# Patient Record
Sex: Male | Born: 1970 | Race: White | Hispanic: No | Marital: Married
Health system: Midwestern US, Community
[De-identification: ages and names within clinical notes are randomized; demographics above are authoritative.]

## PROBLEM LIST (undated history)

## (undated) DIAGNOSIS — L989 Disorder of the skin and subcutaneous tissue, unspecified: Secondary | ICD-10-CM

## (undated) DIAGNOSIS — K8010 Calculus of gallbladder with chronic cholecystitis without obstruction: Secondary | ICD-10-CM

## (undated) HISTORY — DX: Disorder of the skin and subcutaneous tissue, unspecified: L98.9

## (undated) HISTORY — PX: OTHER SURGICAL HISTORY: SHX169

## (undated) HISTORY — PX: CHOLECYSTECTOMY: SHX55

---

## 2012-10-04 NOTE — ED Notes (Signed)
Pt reports cough since yesterday but he gets bronchitis frequently and he is certain that his cough is going to turn into bronchitis again. Pt states he needs a kenalog shot and a strong antibiotic because that is what he always gets. Pt does not appear in distress and speaks in full sentences.

## 2012-10-04 NOTE — ED Provider Notes (Signed)
Patient is a 42 y.o. male presenting with cough. The history is provided by the patient.   Cough  This is a new problem. The current episode started 12 to 24 hours ago. The problem occurs constantly. The problem has been gradually worsening. The cough is productive of sputum. There has been no fever. Pertinent negatives include no wheezing, no nausea and no vomiting. Associated symptoms comments: Congestion, sinus drainage . He has tried nothing for the symptoms. The treatment provided no relief. He is not a smoker. His past medical history is significant for bronchitis. His past medical history does not include pneumonia or asthma.        History reviewed. No pertinent past medical history.     History reviewed. No pertinent past surgical history.      History reviewed. No pertinent family history.     History     Social History   ??? Marital Status: MARRIED     Spouse Name: N/A     Number of Children: N/A   ??? Years of Education: N/A     Occupational History   ??? Not on file.     Social History Main Topics   ??? Smoking status: Not on file   ??? Smokeless tobacco: Not on file   ??? Alcohol Use: Not on file   ??? Drug Use: Not on file   ??? Sexually Active: Not on file     Other Topics Concern   ??? Not on file     Social History Narrative   ??? No narrative on file                  ALLERGIES: Review of patient's allergies indicates no known allergies.      Review of Systems   Respiratory: Positive for cough. Negative for wheezing.    Gastrointestinal: Negative for nausea and vomiting.   All other systems reviewed and are negative.        Filed Vitals:    10/04/12 1758   BP: 184/96   Pulse: 88   Temp: 99 ??F (37.2 ??C)   Resp: 26   Height: 5\' 10"  (1.778 m)   Weight: 127.007 kg (280 lb)   SpO2: 95%            Physical Exam   Nursing note and vitals reviewed.  Constitutional: He is oriented to person, place, and time. He appears well-developed and well-nourished. No distress.   HENT:   Head: Normocephalic and atraumatic.   Right Ear:  External ear normal.   Left Ear: External ear normal.   Mouth/Throat: Oropharynx is clear and moist.   Upper airway congestion   Eyes: Conjunctivae and EOM are normal. Pupils are equal, round, and reactive to light.   Neck: Normal range of motion. Neck supple.   Cardiovascular: Normal rate and regular rhythm.    Pulmonary/Chest: Effort normal and breath sounds normal. No respiratory distress. He has no wheezes. He has no rales.   Abdominal: Soft. Bowel sounds are normal.   Musculoskeletal: Normal range of motion. He exhibits no edema.   Neurological: He is alert and oriented to person, place, and time.   Skin: Skin is warm.        MDM     Differential Diagnosis; Clinical Impression; Plan:     Chest x ray clear, wll treat bronchitis   Amount and/or Complexity of Data Reviewed:   Tests in the radiology section of CPT??:  Ordered and reviewed  Risk of Significant  Complications, Morbidity, and/or Mortality:   Presenting problems:  Low  Diagnostic procedures:  Low  Management options:  Low  Progress:   Patient progress:  Improved and stable      Procedures

## 2012-10-04 NOTE — ED Provider Notes (Signed)
I was personally available for consultation in the emergency department.  I have reviewed the chart and agree with the documentation recorded by the MLP, including the assessment, treatment plan, and disposition.

## 2012-10-04 NOTE — ED Notes (Signed)
I have reviewed discharge instructions with the patient.  The patient verbalized understanding. Patient was given prescription. Pt ambulatory upon discharge home.

## 2013-06-21 NOTE — ED Provider Notes (Signed)
I was personally available for consultation in the emergency department.  I have reviewed the chart and agree with the documentation recorded by the MLP, including the assessment, treatment plan, and disposition. Victorina Kable R Jessamyn Watterson, MD

## 2013-06-21 NOTE — ED Notes (Signed)
I have reviewed discharge instructions with the patient.  The patient verbalized understanding.

## 2013-06-21 NOTE — ED Provider Notes (Signed)
Patient is a 42 y.o. male presenting with cough. The history is provided by the patient.   Cough  This is a new problem. The current episode started more than 2 days ago. The problem occurs constantly. The problem has been gradually worsening. The cough is non-productive. There has been no fever. Associated symptoms include wheezing. Pertinent negatives include no chills, no sweats and no shortness of breath. Associated symptoms comments: Congestion, sinus drainage . Treatments tried: nyquil. The treatment provided mild relief. He is not a smoker. His past medical history is significant for bronchitis. His past medical history does not include pneumonia.        History reviewed. No pertinent past medical history.     Past Surgical History   Procedure Laterality Date   ??? Hx orthopaedic           History reviewed. No pertinent family history.     History     Social History   ??? Marital Status: MARRIED     Spouse Name: N/A     Number of Children: N/A   ??? Years of Education: N/A     Occupational History   ??? Not on file.     Social History Main Topics   ??? Smoking status: Former Smoker -- 0.50 packs/day for 20 years   ??? Smokeless tobacco: Never Used   ??? Alcohol Use: No   ??? Drug Use: No   ??? Sexually Active: Not on file     Other Topics Concern   ??? Not on file     Social History Narrative   ??? No narrative on file                  ALLERGIES: Review of patient's allergies indicates no known allergies.      Review of Systems   Constitutional: Negative for chills.   Respiratory: Positive for cough and wheezing. Negative for shortness of breath.    All other systems reviewed and are negative.        Filed Vitals:    06/21/13 1100   BP: 173/98   Pulse: 83   Temp: 98.4 ??F (36.9 ??C)   Resp: 20   Height: 5\' 10"  (1.778 m)   Weight: 130.182 kg (287 lb)   SpO2: 96%            Physical Exam   Nursing note and vitals reviewed.  Constitutional: He is oriented to person, place, and time. He appears well-developed and well-nourished. No  distress.   HENT:   Head: Normocephalic and atraumatic.   Right Ear: External ear normal.   Left Ear: External ear normal.   Upper airway congestion    Eyes: Conjunctivae and EOM are normal. Pupils are equal, round, and reactive to light.   Neck: Normal range of motion. Neck supple.   Cardiovascular: Normal rate, regular rhythm and normal heart sounds.    Pulmonary/Chest: Effort normal and breath sounds normal. No respiratory distress. He has no wheezes.   Abdominal: Soft. Bowel sounds are normal.   Musculoskeletal: He exhibits no edema and no tenderness.   Neurological: He is alert and oriented to person, place, and time.   Skin: Skin is warm and dry.   Psychiatric: He has a normal mood and affect.        MDM     Differential Diagnosis; Clinical Impression; Plan:     Bronchitis      Amount and/or Complexity of Data Reviewed:    Review and summarize  past medical records:  Yes  Risk of Significant Complications, Morbidity, and/or Mortality:   Presenting problems:  Low  Diagnostic procedures:  Low  Management options:  Low  Progress:   Patient progress:  Improved and stable      Procedures

## 2015-05-16 ENCOUNTER — Inpatient Hospital Stay
Admit: 2015-05-16 | Discharge: 2015-05-16 | Disposition: A | Discharge status: Home / Self Care | Payer: MEDICAID | Attending: Emergency Medicine

## 2015-05-16 ENCOUNTER — Emergency Department: Admit: 2015-05-16 | Payer: MEDICAID

## 2015-05-16 DIAGNOSIS — K8050 Calculus of bile duct without cholangitis or cholecystitis without obstruction: Secondary | ICD-10-CM

## 2015-05-16 LAB — METABOLIC PANEL, COMPREHENSIVE
A-G Ratio: 1.3 (ref 1.2–3.5)
ALT (SGPT): 20 U/L (ref 12–65)
AST (SGOT): 14 U/L — ABNORMAL LOW (ref 15–37)
Albumin: 3.8 g/dL (ref 3.5–5.0)
Alk. phosphatase: 56 U/L (ref 50–136)
Anion gap: 8 mmol/L (ref 7–16)
BUN: 14 MG/DL (ref 6–23)
Bilirubin, total: 0.3 MG/DL (ref 0.2–1.1)
CO2: 26 mmol/L (ref 21–32)
Calcium: 9.2 MG/DL (ref 8.3–10.4)
Chloride: 107 mmol/L (ref 98–107)
Creatinine: 1.18 MG/DL (ref 0.8–1.5)
GFR est AA: >60 mL/min/{1.73_m2} (ref >60)
GFR est non-AA: >60 mL/min/{1.73_m2} (ref >60)
Globulin: 2.9 g/dL (ref 2.3–3.5)
Glucose: 120 mg/dL — ABNORMAL HIGH (ref 65–100)
Potassium: 3.7 mmol/L (ref 3.5–5.1)
Protein, total: 6.7 g/dL (ref 6.3–8.2)
Sodium: 141 mmol/L (ref 136–145)

## 2015-05-16 LAB — CBC WITH AUTOMATED DIFF
ABS. BASOPHILS: 0.1 10*3/uL (ref 0.0–0.2)
ABS. BASOPHILS: 0 10*3/uL (ref 0.0–0.2)
ABS. EOSINOPHILS: 0.1 10*3/uL (ref 0.0–0.8)
ABS. EOSINOPHILS: 0 10*3/uL (ref 0.0–0.8)
ABS. IMM. GRANS.: 0 10*3/uL (ref 0.0–0.5)
ABS. IMM. GRANS.: 0 10*3/uL (ref 0.0–0.5)
ABS. LYMPHOCYTES: 1.6 10*3/uL (ref 0.5–4.6)
ABS. LYMPHOCYTES: 0.7 10*3/uL (ref 0.5–4.6)
ABS. MONOCYTES: 0.4 10*3/uL (ref 0.1–1.3)
ABS. MONOCYTES: 0.4 10*3/uL (ref 0.1–1.3)
ABS. NEUTROPHILS: 6.6 10*3/uL (ref 1.7–8.2)
ABS. NEUTROPHILS: 10.9 10*3/uL — ABNORMAL HIGH (ref 1.7–8.2)
BASOPHILS: 1 % (ref 0.0–2.0)
BASOPHILS: 0 % (ref 0.0–2.0)
EOSINOPHILS: 2 % (ref 0.5–7.8)
EOSINOPHILS: 0 % — ABNORMAL LOW (ref 0.5–7.8)
HCT: 43 % (ref 41.1–50.3)
HCT: 43.1 % (ref 41.1–50.3)
HGB: 15.2 g/dL (ref 13.6–17.2)
HGB: 14.9 g/dL (ref 13.6–17.2)
IMMATURE GRANULOCYTES: 0.3 % (ref 0.0–5.0)
IMMATURE GRANULOCYTES: 0.1 % (ref 0.0–5.0)
LYMPHOCYTES: 18 % (ref 13–44)
LYMPHOCYTES: 6 % — ABNORMAL LOW (ref 13–44)
MCH: 33.8 PG — ABNORMAL HIGH (ref 26.1–32.9)
MCH: 33.2 PG — ABNORMAL HIGH (ref 26.1–32.9)
MCHC: 35.3 g/dL — ABNORMAL HIGH (ref 31.4–35.0)
MCHC: 34.6 g/dL (ref 31.4–35.0)
MCV: 95.6 FL (ref 79.6–97.8)
MCV: 96 FL (ref 79.6–97.8)
MONOCYTES: 5 % (ref 4.0–12.0)
MONOCYTES: 3 % — ABNORMAL LOW (ref 4.0–12.0)
MPV: 10.5 FL — ABNORMAL LOW (ref 10.8–14.1)
MPV: 10.3 FL — ABNORMAL LOW (ref 10.8–14.1)
NEUTROPHILS: 74 % (ref 43–78)
NEUTROPHILS: 91 % — ABNORMAL HIGH (ref 43–78)
PLATELET: 210 10*3/uL (ref 150–450)
PLATELET: 199 10*3/uL (ref 150–450)
RBC: 4.5 M/uL (ref 4.23–5.67)
RBC: 4.49 M/uL (ref 4.23–5.67)
RDW: 12.6 % (ref 11.9–14.6)
RDW: 12.2 % (ref 11.9–14.6)
WBC: 8.9 10*3/uL (ref 4.3–11.1)
WBC: 12 10*3/uL — ABNORMAL HIGH (ref 4.3–11.1)

## 2015-05-16 LAB — TROPONIN I: Troponin-I, Qt.: <0.04 NG/ML (ref 0.02–0.05)

## 2015-05-16 LAB — HEPATIC FUNCTION PANEL
A-G Ratio: 1.3 (ref 1.2–3.5)
ALT (SGPT): 32 U/L (ref 12–65)
AST (SGOT): 23 U/L (ref 15–37)
Albumin: 3.6 g/dL (ref 3.5–5.0)
Alk. phosphatase: 52 U/L (ref 50–136)
Bilirubin, direct: 0.2 MG/DL (ref <0.4)
Bilirubin, total: 0.6 MG/DL (ref 0.2–1.1)
Globulin: 2.8 g/dL (ref 2.3–3.5)
Protein, total: 6.4 g/dL (ref 6.3–8.2)

## 2015-05-16 LAB — LIPASE
Lipase: 218 U/L (ref 73–393)
Lipase: 139 U/L (ref 73–393)

## 2015-05-16 LAB — POC TROPONIN: Troponin-I (POC): 0 ng/ml (ref 0.0–0.08)

## 2015-05-16 MED ORDER — SODIUM CHLORIDE 0.9 % IV
Freq: Once | INTRAVENOUS | Status: AC
Start: 2015-05-16 — End: 2015-05-16
  Administered 2015-05-16: 17:00:00 via INTRAVENOUS

## 2015-05-16 MED ORDER — SODIUM CHLORIDE 0.9% BOLUS IV
0.9 % | Freq: Once | INTRAVENOUS | Status: AC
Start: 2015-05-16 — End: 2015-05-16
  Administered 2015-05-16: 19:00:00 via INTRAVENOUS

## 2015-05-16 MED ORDER — AMOXICILLIN CLAVULANATE 875 MG-125 MG TAB
875-125 mg | ORAL_TABLET | Freq: Two times a day (BID) | ORAL | 0 refills | Status: AC
Start: 2015-05-16 — End: ?

## 2015-05-16 MED ORDER — ONDANSETRON 4 MG TAB, RAPID DISSOLVE
4 mg | ORAL_TABLET | Freq: Three times a day (TID) | ORAL | 0 refills | Status: AC | PRN
Start: 2015-05-16 — End: ?

## 2015-05-16 MED ORDER — HYDROMORPHONE (PF) 1 MG/ML IJ SOLN
1 mg/mL | Freq: Once | INTRAMUSCULAR | Status: AC
Start: 2015-05-16 — End: 2015-05-16
  Administered 2015-05-16: 17:00:00 via INTRAVENOUS

## 2015-05-16 MED ORDER — PROMETHAZINE 25 MG/ML INJECTION
25 mg/mL | INTRAMUSCULAR | Status: AC
Start: 2015-05-16 — End: 2015-05-16
  Administered 2015-05-16: 17:00:00 via INTRAMUSCULAR

## 2015-05-16 MED ORDER — OXYCODONE 5 MG TAB
5 mg | ORAL_TABLET | ORAL | 0 refills | Status: AC
Start: 2015-05-16 — End: ?

## 2015-05-16 MED FILL — HYDROMORPHONE (PF) 1 MG/ML IJ SOLN: 1 mg/mL | INTRAMUSCULAR | Qty: 1

## 2015-05-16 MED FILL — PROMETHAZINE 25 MG/ML INJECTION: 25 mg/mL | INTRAMUSCULAR | Qty: 1

## 2015-05-16 NOTE — ED Notes (Signed)
One side rail up, bed in low position,bed brakes on, call light within reach.  Pt resting in bed,resp easy,VSS,family at bedside, belongs in reach. Offered restroom and change in position.

## 2015-05-16 NOTE — ED Provider Notes (Signed)
HPI Comments: Patient had been normal when he got up this morning he then developed intense pain radiating into his back and later for additional pain to the central abdomen and right upper abdomen.  There was no chest discomfort.  There is no shortness of breath.  He did have nausea and vomiting on the way to the emergency department and a single time in our department after arrival.  Pancreatitis.  He has no history of known gallbladder disease.  He states to specific reviewing no GI history known to him in the past.  He had eaten a moderately fatty breakfast.  No fever or chills.  It is of note patient has had about 130 pound weight loss over the last somewhat over 1 year.  This is been by voluntary dietary changes.  No abdominal distention is noted.    Patient is a 44 y.o. male presenting with back pain. The history is provided by the patient.   Back Pain    This is a new problem. The current episode started less than 1 hour ago. The pain is associated with no known injury. The pain is severe. Associated symptoms include abdominal pain. Pertinent negatives include no abdominal swelling, no bladder incontinence, no dysuria and no paresthesias. He has tried nothing for the symptoms.        History reviewed. No pertinent past medical history.    Past Surgical History:   Procedure Laterality Date   ??? Hx orthopaedic           History reviewed. No pertinent family history.    Social History     Social History   ??? Marital status: MARRIED     Spouse name: N/A   ??? Number of children: N/A   ??? Years of education: N/A     Occupational History   ??? Not on file.     Social History Main Topics   ??? Smoking status: Former Smoker     Packs/day: 0.50     Years: 20.00   ??? Smokeless tobacco: Never Used   ??? Alcohol use No   ??? Drug use: No   ??? Sexual activity: Not on file     Other Topics Concern   ??? Not on file     Social History Narrative         ALLERGIES: Review of patient's allergies indicates no known allergies.     Review of Systems   Gastrointestinal: Positive for abdominal pain, nausea and vomiting. Negative for diarrhea.   Genitourinary: Negative.  Negative for bladder incontinence and dysuria.   Musculoskeletal: Positive for back pain.   Neurological: Negative.  Negative for paresthesias.   All other systems reviewed and are negative.      Vitals:    05/16/15 1243 05/16/15 1330 05/16/15 1348 05/16/15 1501   BP:    145/77   Pulse: (!) 43 (!) 46 (!) 58 (!) 43   Resp: 20   16   Temp:       SpO2: 100% 98% 98% 99%   Weight:       Height:                Physical Exam   Constitutional: He appears well-developed and well-nourished. He appears distressed.   Arrival patient appeared to be in extreme pain was very restless and somewhat diaphoretic.   HENT:   Head: Atraumatic.   Mouth/Throat: Oropharynx is clear and moist.   Eyes: No scleral icterus.   Neck: Neck supple.  Cardiovascular: Normal rate and regular rhythm.    Pulmonary/Chest: Effort normal. No respiratory distress.   Abdominal: Soft. Normal aorta. Bowel sounds are decreased. There is tenderness. There is positive Murphy's sign. There is no rigidity, no guarding and no tenderness at McBurney's point. No hernia.   Musculoskeletal: Normal range of motion. He exhibits no tenderness.   Neurological: He is alert. Coordination normal.   Skin: Skin is warm. He is diaphoretic.   Psychiatric: His behavior is normal. Thought content normal.   Nursing note and vitals reviewed.       MDM  Number of Diagnoses or Management Options  Diagnosis management comments: Acute colically mid abd/ruq pain with intense associate mid back pain. Of concern for GB and pancreas issue. Also could br ulcer or colitis or other gi issue       Amount and/or Complexity of Data Reviewed  Clinical lab tests: reviewed and ordered  Tests in the radiology section of CPT??: reviewed and ordered  Obtain history from someone other than the patient: yes  Discuss the patient with other providers: yes   Independent visualization of images, tracings, or specimens: yes    Risk of Complications, Morbidity, and/or Mortality  Presenting problems: high  Diagnostic procedures: low  Management options: high    Patient Progress  Patient progress: stable      Procedures       ?? [place] ABD LTD (Final result) Result time: 05/16/15 14:22:05   ?? Final result by Ian Malkin., MD (05/16/15 14:22:05)   ?? Impression:   ?? IMPRESSION: Cholelithiasis with positive sonographic Murphy's sign. These  findings may be present with acute cholecystitis. Consider surgical evaluation.     ?? Narrative:   ?? RIGHT UPPER QUADRANT ULTRASOUND 05/16/2015    HISTORY: acute severe upper/mid abdominal pain with radiation into his back; ?? ??    TECHNIQUE: Sonographic imaging of the right upper quadrant was performed.    COMPARISON: None    FINDINGS: The pancreatic head is normal in appearance ??There is no intrahepatic  biliary ductal dilatation. ??The gallbladder is distended with multiple shadowing  gallstones. ??The sonographic Murphy's sign is present.     ??The common bile duct is 4 mm in diameter. ??The right kidney is 12.1 cm in  length. ??There is no hydronephrosis. ??The distal aorta is 1.9cm in diameter.   The IVC is patent.     ??    ??     Recent Results (from the past 12 hour(s))   CBC WITH AUTOMATED DIFF    Collection Time: 05/16/15 12:30 PM   Result Value Ref Range    WBC 8.9 4.3 - 11.1 K/uL    RBC 4.50 4.23 - 5.67 M/uL    HGB 15.2 13.6 - 17.2 g/dL    HCT 43.0 41.1 - 50.3 %    MCV 95.6 79.6 - 97.8 FL    MCH 33.8 (H) 26.1 - 32.9 PG    MCHC 35.3 (H) 31.4 - 35.0 g/dL    RDW 12.6 11.9 - 14.6 %    PLATELET 210 150 - 450 K/uL    MPV 10.5 (L) 10.8 - 14.1 FL    DF AUTOMATED      NEUTROPHILS 74 43 - 78 %    LYMPHOCYTES 18 13 - 44 %    MONOCYTES 5 4.0 - 12.0 %    EOSINOPHILS 2 0.5 - 7.8 %    BASOPHILS 1 0.0 - 2.0 %    IMMATURE GRANULOCYTES 0.3 0.0 -  5.0 %    ABS. NEUTROPHILS 6.6 1.7 - 8.2 K/UL    ABS. LYMPHOCYTES 1.6 0.5 - 4.6 K/UL     ABS. MONOCYTES 0.4 0.1 - 1.3 K/UL    ABS. EOSINOPHILS 0.1 0.0 - 0.8 K/UL    ABS. BASOPHILS 0.1 0.0 - 0.2 K/UL    ABS. IMM. GRANS. 0.0 0.0 - 0.5 K/UL   METABOLIC PANEL, COMPREHENSIVE    Collection Time: 05/16/15 12:30 PM   Result Value Ref Range    Sodium 141 136 - 145 mmol/L    Potassium 3.7 3.5 - 5.1 mmol/L    Chloride 107 98 - 107 mmol/L    CO2 26 21 - 32 mmol/L    Anion gap 8 7 - 16 mmol/L    Glucose 120 (H) 65 - 100 mg/dL    BUN 14 6 - 23 MG/DL    Creatinine 1.18 0.8 - 1.5 MG/DL    GFR est AA >60 >60 ml/min/1.66m    GFR est non-AA >60 >60 ml/min/1.762m   Calcium 9.2 8.3 - 10.4 MG/DL    Bilirubin, total 0.3 0.2 - 1.1 MG/DL    ALT 20 12 - 65 U/L    AST 14 (L) 15 - 37 U/L    Alk. phosphatase 56 50 - 136 U/L    Protein, total 6.7 6.3 - 8.2 g/dL    Albumin 3.8 3.5 - 5.0 g/dL    Globulin 2.9 2.3 - 3.5 g/dL    A-G Ratio 1.3 1.2 - 3.5     LIPASE    Collection Time: 05/16/15 12:30 PM   Result Value Ref Range    Lipase 218 73 - 393 U/L   POC TROPONIN-I    Collection Time: 05/16/15 12:31 PM   Result Value Ref Range    Troponin-I (POC) 0 0.0 - 0.08 ng/ml

## 2015-05-16 NOTE — ED Notes (Signed)
PMD-None. Pt c/o bilateral flank/back pain. Onset 20 minutes ago.

## 2015-05-16 NOTE — ED Notes (Signed)
After assessing pt, he had informed me that this pain started in his mid epigastric region while he was in the shower that then radiated into his back. Pt has no cardiac/gastro hx. Pt states he vomited on the way here. Pt then started to raise his chest up off the bed stating that his pain is his stomach was so severe. Pt became diaphoretic and thriving around int he bed with pain. I preceded to hook pt up to cardiac monitor/ EKG. Dr Verlon Au reviewed and came ti assess pt at approx. 1230.

## 2015-05-16 NOTE — ED Notes (Signed)
Pt returned form ultrasound

## 2015-05-16 NOTE — H&P (View-Only) (Signed)
Kasandra Knudsen, M.D.   Colon and Rectal Surgeon  [facility]  [address]  [place], [place] [address]  Phone: [phone] Fax: [phone]      ED Evaluation/preop H&P Note  05/16/2015    Gregory Hines  MRN: [id number]    Requesting Provider: Marlowe Sax, MD    Primary Care Physician: None    Reason for Consult: abdominal pain    HISTORY OF PRESENT ILLNESS:   Gregory Hines is a 44 y.o. year old male who presented to the emergency department for abdominal pain.  Pain started this morning.  He ate ham and eggs for breakfast and went to take a shower.  He developed severe, knife-like epigastric pain that radiated into his back about 10 minutes after the shower and had cold sweats.  He tried to take some Rolaids without improvement.  He reported it as 9-10/10 in severity.  He had never had similar pain before.  He had associated nausea and vomiting.  Because of the pain, he came to the ED.    He denies any urinary pain or hematuria.  He denies a history of cardiac problems.  He has a significant weight loss from 307-->173 from diet alone and feels to be in relatively good health.  He does report chronic constipation and averages a BM about every 2 weeks.  He has tried several medications in the past for that without improvement.  In the ED, he was given dilaudid 0.5 mg and was heavily sedated from this for several hours.  He currently has minimal abdominal discomfort, no n/v.    REVIEW OF SYSTEMS:   Constitutional: No fevers/chills, no weakness, no fatigue, no lightheadedness, no night sweats, no insomnia, no appetite changes, no weight changes, no memory problems.   Eyes: No changes in vision, no diplopia, no tearing, no scotomata, no pain   Ears/Nose/Throat/Mouth:  No change in hearing, no tinnitus, no bleeding, no epistaxis, no nasal discharge, no sinusitis.   Respiratory: No cough, no dyspnea, no wheezing, no hemoptysis, no sleep apnea    Cardiovascular: No chest pains, no chest pressure, no chest tightness, no palpitations   Gastrointestinal: +abdominal pains, no bowel habit changes, +nausea, +emesis, no hematochezia, no melena, no cramping, +constipation, no diarrhea, no incontinence, no jaundice, no diverticulosis. Otherwise per HPI   Genitourinary: No dysuria, no hematuria, no urinary frequency, no nocturia, no recent UTIs   Musculoskeletal: No back/neck pain, no arthritis, no joint pain/swelling, no muscle pains   Skin: No rashes, no itching, no ulcers   Hematologic:  No easy bruising, no anemia   Neurologic: No headaches, no dizziness, no seizures   Psychiatric: No depressive symptoms, no anxiety symptoms, no changes in mood, no substance abuse     PAST MEDICAL HISTORY:  None    PAST SURGICAL HISTORY:  Right knee arthroscopy, left hand ganglion cyst, colonoscopy ~2008    ALLERGIES: No Known Allergies    HOME MEDICATIONS: None    SOCIAL HISTORY: Married to his wife of 15 years.  They live in [place].  He has two children.  He is a [employer].  Smokes 1.5 ppd x 30 years.  NO alcohol use    PREVENTION: Last colonoscopy ~2008--in [place], negative    FAMILY HISTORY: No colon or rectal cancer.  No history of polyps.  No breast, prostate, ovary, endometrial, gastric, or pancreatic cancers.    PHYSICAL EXAM:  Blood pressure 145/77, pulse (!) 57, temperature 97.4 ??F (36.3 ??C), resp. rate Marland Kitchen)  55, height 5\' 10"  (1.778 m), weight 174 lb (78.9 kg), SpO2 99 %.  Body mass index is 24.97 kg/(m^2).  The patient was evaluated in the presence of a medical assistant  General: Normotensive, in no acute distress, well developed, well nourished appearing   Head:  AT/NC, no lesions   Eyes:  PERRLA, EOM's full, conjunctivae clear   Neck:  Supple, no masses, no lymphadenopathy, no thyromegaly, no carotid bruits   Chest:  Lungs clear, no rales, no rhonchi, no wheezes   Heart:  RR, no murmurs, no rubs, no gallops    Abdomen:  Soft, Very minimal epigastric tenderness with deep palpation, negative palpation in RUQ.  no rebound, no guarding, no masses, nondistended.   Excess skin consistent with significant weight loss   Rectal:  Deferred   Back:  Normal curvature, no tenderness.  Negative Murphy's punch.   Extremities:  FROM, no deformities, no edema, no erythema   Neuro:  Physiologic, oriented x3, full affect, no localizing findings   Skin:  Normal, no rashes, no lesions noted.          Recent Labs      05/16/15   1601  05/16/15   1230   WBC  12.0*  8.9   HGB  14.9  15.2   HCT  43.1  43.0   PLT  199  210       Recent Labs      05/16/15   1601  05/16/15   1230   NA   --   141   K   --   3.7   CL   --   107   CO2   --   26   BUN   --   14   CREA   --   1.18   TBILI  0.6  0.3   SGOT  23  14*   ALT  32  20   AP  52  56       Lipase 218-->139  Troponin negative x 2  Labs reviewed by me.    IMAGING: Images reviewed by me.  05/16/15--Ultrasound: "The pancreatic head is normal in appearance There is no intrahepatic biliary ductal dilatation. The gallbladder is distended with multiple shadowing gallstones. The sonographic Murphy's sign is present. ?? ?? The common bile duct is 4 mm in diameter. The right kidney is 12.1 cm in length. There is no hydronephrosis. The distal aorta is 1.9cm in diameter. ?? The IVC is patent. ?? IMPRESSION: Cholelithiasis with positive sonographic Murphy's sign. These findings may be present with acute cholecystitis. Consider surgical evaluation."    ASSESSMENT:  Biliary colic, improved  Epigastric abdominal pain, due to above    RECOMMENDATIONS:  --He is nontoxic appearing now and has normal LFTs.  His pain is now minimal/resolved.  --I think he is safe to discharge home with planned cholecystectomy later this week.  He is open to this plan  --I gave him a script for Oxycodone 5 mg q4h prn pain, #40, no refills  --I gave him a script for Zofran ODT 4 mg SQ q6h prn n/v, #20, no refills   --I gave him a script for Augmentin 875/125 1 tab BID x 5 days, in case there is a component of cholecystitis, but I do not think so.  --I instructed him to stay on a light, low/no fat diet for the next three days until his operation  --I will get my office to schedule him for  Wednesday afternoon  --I will check labs in preop to assure we do not need to do a cholangiogram  --I instructed him to call and return if he develops recurrence of the severe, intractable pain, and we will admit him for the operation.    --I had an informed consent discussion with the patient for laparoscopic or open cholecystectomy with possible intraoperative cholangiogram.  I delineated the risks of bleeding, infection, abscess, damage to CBD, retained CBD stone, CBD stricture, damage to bowel, bladder, blood vessels, nerves, stomach, liver, bile leak, hernia, need for reoperation, need for further procedures, failure of the procedure to resolve her nausea/abdominal pain, DVT, PE, UTI and the usual cardiac and respiratory risks inherent in surgery.  Questions were answered.  I drew pictures to explain things.     --Discussed with patient, wife in ED and with Dr Verlon Au    Kasandra Knudsen, MD  05/16/2015      Elements of this note have been created with speech-recognition software.  As a result, errors in speech recognition may have occurred.

## 2015-05-16 NOTE — Progress Notes (Signed)
Jaelynn Currier M. Shaira Sova, M.D.   Colon and Rectal Surgeon  [facility]  [address], [address]  [place] [address]  Phone: [phone] Fax: [phone]      ED Evaluation/preop H&P Note  05/16/2015    Danyael Fogarty  MRN: [id number]    Requesting Provider: Daniel Wetenhall, MD    Primary Care Physician: None    Reason for Consult: abdominal pain    HISTORY OF PRESENT ILLNESS:   Gregory Hines is a 44 y.o. year old male who presented to the emergency department for abdominal pain.  Pain started this morning.  He ate ham and eggs for breakfast and went to take a shower.  He developed severe, knife-like epigastric pain that radiated into his back about 10 minutes after the shower and had cold sweats.  He tried to take some Rolaids without improvement.  He reported it as 9-10/10 in severity.  He had never had similar pain before.  He had associated nausea and vomiting.  Because of the pain, he came to the ED.    He denies any urinary pain or hematuria.  He denies a history of cardiac problems.  He has a significant weight loss from 307-->173 from diet alone and feels to be in relatively good health.  He does report chronic constipation and averages a BM about every 2 weeks.  He has tried several medications in the past for that without improvement.  In the ED, he was given dilaudid 0.5 mg and was heavily sedated from this for several hours.  He currently has minimal abdominal discomfort, no n/v.    REVIEW OF SYSTEMS:   Constitutional: No fevers/chills, no weakness, no fatigue, no lightheadedness, no night sweats, no insomnia, no appetite changes, no weight changes, no memory problems.   Eyes: No changes in vision, no diplopia, no tearing, no scotomata, no pain   Ears/Nose/Throat/Mouth:  No change in hearing, no tinnitus, no bleeding, no epistaxis, no nasal discharge, no sinusitis.   Respiratory: No cough, no dyspnea, no wheezing, no hemoptysis, no sleep apnea    Cardiovascular: No chest pains, no chest pressure, no chest tightness, no palpitations   Gastrointestinal: +abdominal pains, no bowel habit changes, +nausea, +emesis, no hematochezia, no melena, no cramping, +constipation, no diarrhea, no incontinence, no jaundice, no diverticulosis. Otherwise per HPI   Genitourinary: No dysuria, no hematuria, no urinary frequency, no nocturia, no recent UTIs   Musculoskeletal: No back/neck pain, no arthritis, no joint pain/swelling, no muscle pains   Skin: No rashes, no itching, no ulcers   Hematologic:  No easy bruising, no anemia   Neurologic: No headaches, no dizziness, no seizures   Psychiatric: No depressive symptoms, no anxiety symptoms, no changes in mood, no substance abuse     PAST MEDICAL HISTORY:  None    PAST SURGICAL HISTORY:  Right knee arthroscopy, left hand ganglion cyst, colonoscopy ~2008    ALLERGIES: No Known Allergies    HOME MEDICATIONS: None    SOCIAL HISTORY: Married to his wife of 15 years.  They live in [place].  He has two children.  He is a truck driver.  Smokes 1.5 ppd x 30 years.  NO alcohol use    PREVENTION: Last colonoscopy ~2008--in [place], negative    FAMILY HISTORY: No colon or rectal cancer.  No history of polyps.  No breast, prostate, ovary, endometrial, gastric, or pancreatic cancers.    PHYSICAL EXAM:  Blood pressure 145/77, pulse (!) 57, temperature 97.4 ??F (36.3 ??C), resp. rate (!)   55, height 5' 10" (1.778 m), weight 174 lb (78.9 kg), SpO2 99 %.  Body mass index is 24.97 kg/(m^2).  The patient was evaluated in the presence of a medical assistant  General: Normotensive, in no acute distress, well developed, well nourished appearing   Head:  AT/NC, no lesions   Eyes:  PERRLA, EOM's full, conjunctivae clear   Neck:  Supple, no masses, no lymphadenopathy, no thyromegaly, no carotid bruits   Chest:  Lungs clear, no rales, no rhonchi, no wheezes   Heart:  RR, no murmurs, no rubs, no gallops    Abdomen:  Soft, Very minimal epigastric tenderness with deep palpation, negative palpation in RUQ.  no rebound, no guarding, no masses, nondistended.   Excess skin consistent with significant weight loss   Rectal:  Deferred   Back:  Normal curvature, no tenderness.  Negative Murphy's punch.   Extremities:  FROM, no deformities, no edema, no erythema   Neuro:  Physiologic, oriented x3, full affect, no localizing findings   Skin:  Normal, no rashes, no lesions noted.          Recent Labs      05/16/15   1601  05/16/15   1230   WBC  12.0*  8.9   HGB  14.9  15.2   HCT  43.1  43.0   PLT  199  210       Recent Labs      05/16/15   1601  05/16/15   1230   NA   --   141   K   --   3.7   CL   --   107   CO2   --   26   BUN   --   14   CREA   --   1.18   TBILI  0.6  0.3   SGOT  23  14*   ALT  32  20   AP  52  56       Lipase 218-->139  Troponin negative x 2  Labs reviewed by me.    IMAGING: Images reviewed by me.  05/16/15--Ultrasound: "The pancreatic head is normal in appearance There is no intrahepatic biliary ductal dilatation. The gallbladder is distended with multiple shadowing gallstones. The sonographic Murphy's sign is present. ?? ?? The common bile duct is 4 mm in diameter. The right kidney is 12.1 cm in length. There is no hydronephrosis. The distal aorta is 1.9cm in diameter. ?? The IVC is patent. ?? IMPRESSION: Cholelithiasis with positive sonographic Murphy's sign. These findings may be present with acute cholecystitis. Consider surgical evaluation."    ASSESSMENT:  Biliary colic, improved  Epigastric abdominal pain, due to above    RECOMMENDATIONS:  --He is nontoxic appearing now and has normal LFTs.  His pain is now minimal/resolved.  --I think he is safe to discharge home with planned cholecystectomy later this week.  He is open to this plan  --I gave him a script for Oxycodone 5 mg q4h prn pain, #40, no refills  --I gave him a script for Zofran ODT 4 mg SQ q6h prn n/v, #20, no refills   --I gave him a script for Augmentin 875/125 1 tab BID x 5 days, in case there is a component of cholecystitis, but I do not think so.  --I instructed him to stay on a light, low/no fat diet for the next three days until his operation  --I will get my office to schedule him for   Wednesday afternoon  --I will check labs in preop to assure we do not need to do a cholangiogram  --I instructed him to call and return if he develops recurrence of the severe, intractable pain, and we will admit him for the operation.    --I had an informed consent discussion with the patient for laparoscopic or open cholecystectomy with possible intraoperative cholangiogram.  I delineated the risks of bleeding, infection, abscess, damage to CBD, retained CBD stone, CBD stricture, damage to bowel, bladder, blood vessels, nerves, stomach, liver, bile leak, hernia, need for reoperation, need for further procedures, failure of the procedure to resolve her nausea/abdominal pain, DVT, PE, UTI and the usual cardiac and respiratory risks inherent in surgery.  Questions were answered.  I drew pictures to explain things.     --Discussed with patient, wife in ED and with Dr Wetenhall    Aldin Drees M Sanjna Haskew, MD  05/16/2015      Elements of this note have been created with speech-recognition software.  As a result, errors in speech recognition may have occurred.

## 2015-05-16 NOTE — ED Notes (Signed)
Pt taken to ultrasound.

## 2015-05-17 LAB — EKG, 12 LEAD, INITIAL
Atrial Rate: 43 {beats}/min
Calculated P Axis: 56 degrees
Calculated R Axis: 56 degrees
Calculated T Axis: 61 degrees
P-R Interval: 170 ms
Q-T Interval: 458 ms
QRS Duration: 106 ms
QTC Calculation (Bezet): 387 ms
Ventricular Rate: 43 {beats}/min

## 2015-05-18 NOTE — Other (Addendum)
Patient verified name, DOB, and surgery as listed in [facility].     Type 2 surgery, phone assessment complete.  Orders  received.    Labs per surgeon: CBC, BMP, Lipase (to be done DOS)  Labs per anesthesia protocol:    Patient answered medical/surgical history questions at the best of their ability. All prior to admission medications documented in [facility].    Patient instructed to take the following medications the day of surgery according to anesthesia guidelines with a small sip of water: zofran, oxycodone if needed     Patient instructed on the following and verbalizes understanding:  Arrive at Entrance A, time of arrival to be called the day before by 1700  NPO after midnight including gum, mints, and ice chips  Hold all vitamins 7 days prior to surgery and NSAIDS 5 days prior to surgery.  Use Hibiclens in shower the night before surgery and on the morning of surgery  Leave all valuables(money and jewelry) at home but bring insurance card and ID on DOS  Do not wear make-up, nail polish, lotions, cologne, perfumes, powders, or oil on skin

## 2015-05-19 ENCOUNTER — Inpatient Hospital Stay

## 2015-05-19 ENCOUNTER — Inpatient Hospital Stay: Payer: MEDICAID

## 2015-05-19 LAB — CBC WITH AUTOMATED DIFF
ABS. BASOPHILS: 0.1 10*3/uL (ref 0.0–0.2)
ABS. EOSINOPHILS: 0.2 10*3/uL (ref 0.0–0.8)
ABS. IMM. GRANS.: 0 10*3/uL (ref 0.0–0.5)
ABS. LYMPHOCYTES: 2.1 10*3/uL (ref 0.5–4.6)
ABS. MONOCYTES: 0.6 10*3/uL (ref 0.1–1.3)
ABS. NEUTROPHILS: 4.2 10*3/uL (ref 1.7–8.2)
BASOPHILS: 1 % (ref 0.0–2.0)
EOSINOPHILS: 3 % (ref 0.5–7.8)
HCT: 42.7 % (ref 41.1–50.3)
HGB: 15 g/dL (ref 13.6–17.2)
IMMATURE GRANULOCYTES: 0.1 % (ref 0.0–5.0)
LYMPHOCYTES: 30 % (ref 13–44)
MCH: 33.6 PG — ABNORMAL HIGH (ref 26.1–32.9)
MCHC: 35.1 g/dL — ABNORMAL HIGH (ref 31.4–35.0)
MCV: 95.7 FL (ref 79.6–97.8)
MONOCYTES: 8 % (ref 4.0–12.0)
MPV: 10.8 FL (ref 10.8–14.1)
NEUTROPHILS: 58 % (ref 43–78)
PLATELET: 220 10*3/uL (ref 150–450)
RBC: 4.46 M/uL (ref 4.23–5.67)
RDW: 12.5 % (ref 11.9–14.6)
WBC: 7.1 10*3/uL (ref 4.3–11.1)

## 2015-05-19 LAB — METABOLIC PANEL, COMPREHENSIVE
A-G Ratio: 1.2 (ref 1.2–3.5)
ALT (SGPT): 23 U/L (ref 12–65)
AST (SGOT): 14 U/L — ABNORMAL LOW (ref 15–37)
Albumin: 3.8 g/dL (ref 3.5–5.0)
Alk. phosphatase: 57 U/L (ref 50–136)
Anion gap: 12 mmol/L (ref 7–16)
BUN: 10 MG/DL (ref 6–23)
Bilirubin, total: 0.8 MG/DL (ref 0.2–1.1)
CO2: 24 mmol/L (ref 21–32)
Calcium: 9.4 MG/DL (ref 8.3–10.4)
Chloride: 106 mmol/L (ref 98–107)
Creatinine: 0.8 MG/DL (ref 0.8–1.5)
GFR est AA: >60 mL/min/{1.73_m2} (ref >60)
GFR est non-AA: >60 mL/min/{1.73_m2} (ref >60)
Globulin: 3.2 g/dL (ref 2.3–3.5)
Glucose: 82 mg/dL (ref 65–100)
Potassium: 3.7 mmol/L (ref 3.5–5.1)
Protein, total: 7 g/dL (ref 6.3–8.2)
Sodium: 142 mmol/L (ref 136–145)

## 2015-05-19 LAB — LIPASE: Lipase: 149 U/L (ref 73–393)

## 2015-05-19 MED ORDER — NALOXONE 0.4 MG/ML INJECTION
0.4 mg/mL | INTRAMUSCULAR | Status: DC | PRN
Start: 2015-05-19 — End: 2015-05-19

## 2015-05-19 MED ORDER — ONDANSETRON (PF) 4 MG/2 ML INJECTION
4 mg/2 mL | INTRAMUSCULAR | Status: DC | PRN
Start: 2015-05-19 — End: 2015-05-19
  Administered 2015-05-19: 18:00:00 via INTRAVENOUS

## 2015-05-19 MED ORDER — MIDAZOLAM 1 MG/ML IJ SOLN
1 mg/mL | Freq: Once | INTRAMUSCULAR | Status: DC | PRN
Start: 2015-05-19 — End: 2015-05-19

## 2015-05-19 MED ORDER — LIDOCAINE (PF) 20 MG/ML (2 %) IV SYRINGE
100 mg/5 mL (2 %) | INTRAVENOUS | Status: AC
Start: 2015-05-19 — End: ?

## 2015-05-19 MED ORDER — FENTANYL CITRATE (PF) 50 MCG/ML IJ SOLN
50 mcg/mL | INTRAMUSCULAR | Status: DC | PRN
Start: 2015-05-19 — End: 2015-05-19
  Administered 2015-05-19 (×3): via INTRAVENOUS

## 2015-05-19 MED ORDER — ROCURONIUM 10 MG/ML IV
10 mg/mL | INTRAVENOUS | Status: DC | PRN
Start: 2015-05-19 — End: 2015-05-19
  Administered 2015-05-19 (×3): via INTRAVENOUS

## 2015-05-19 MED ORDER — FENTANYL CITRATE (PF) 50 MCG/ML IJ SOLN
50 mcg/mL | INTRAMUSCULAR | Status: AC
Start: 2015-05-19 — End: ?

## 2015-05-19 MED ORDER — IOTHALAMATE MEGLUMINE 60 % INJECTION
60 % | INTRAMUSCULAR | Status: AC
Start: 2015-05-19 — End: ?

## 2015-05-19 MED ORDER — LIDOCAINE (PF) 20 MG/ML (2 %) IJ SOLN
20 mg/mL (2 %) | INTRAMUSCULAR | Status: DC | PRN
Start: 2015-05-19 — End: 2015-05-19
  Administered 2015-05-19: 17:00:00 via INTRAVENOUS

## 2015-05-19 MED ORDER — ROCURONIUM 10 MG/ML IV
10 mg/mL | INTRAVENOUS | Status: AC
Start: 2015-05-19 — End: ?

## 2015-05-19 MED ORDER — DEXAMETHASONE SODIUM PHOSPHATE 4 MG/ML IJ SOLN
4 mg/mL | INTRAMUSCULAR | Status: DC | PRN
Start: 2015-05-19 — End: 2015-05-19
  Administered 2015-05-19: 17:00:00 via INTRAVENOUS

## 2015-05-19 MED ORDER — LACTATED RINGERS IV
INTRAVENOUS | Status: DC
Start: 2015-05-19 — End: 2015-05-19

## 2015-05-19 MED ORDER — DEXAMETHASONE SODIUM PHOSPHATE 10 MG/ML IJ SOLN
10 mg/mL | INTRAMUSCULAR | Status: AC
Start: 2015-05-19 — End: ?

## 2015-05-19 MED ORDER — KETOROLAC TROMETHAMINE 30 MG/ML INJECTION
30 mg/mL (1 mL) | INTRAMUSCULAR | Status: AC
Start: 2015-05-19 — End: ?

## 2015-05-19 MED ORDER — ACETAMINOPHEN 1,000 MG/100 ML (10 MG/ML) IV
1000 mg/100 mL (10 mg/mL) | Freq: Once | INTRAVENOUS | Status: AC
Start: 2015-05-19 — End: 2015-05-19
  Administered 2015-05-19: 19:00:00 via INTRAVENOUS

## 2015-05-19 MED ORDER — CEFAZOLIN 2 GRAM/50 ML NS IVPB
Freq: Once | INTRAVENOUS | Status: AC
Start: 2015-05-19 — End: 2015-05-19
  Administered 2015-05-19: 17:00:00 via INTRAVENOUS

## 2015-05-19 MED ORDER — SUCCINYLCHOLINE CHLORIDE 20 MG/ML INJECTION
20 mg/mL | INTRAMUSCULAR | Status: AC
Start: 2015-05-19 — End: ?

## 2015-05-19 MED ORDER — INDOCYANINE GREEN 25 MG INJECTION
25 mg | INTRAMUSCULAR | Status: AC
Start: 2015-05-19 — End: ?

## 2015-05-19 MED ORDER — ONDANSETRON (PF) 4 MG/2 ML INJECTION
4 mg/2 mL | INTRAMUSCULAR | Status: AC
Start: 2015-05-19 — End: ?

## 2015-05-19 MED ORDER — OXYCODONE 5 MG TAB
5 mg | Freq: Once | ORAL | Status: DC | PRN
Start: 2015-05-19 — End: 2015-05-19

## 2015-05-19 MED ORDER — PROMETHAZINE 25 MG/ML INJECTION
25 mg/mL | INTRAMUSCULAR | Status: DC | PRN
Start: 2015-05-19 — End: 2015-05-19

## 2015-05-19 MED ORDER — KETOROLAC TROMETHAMINE 30 MG/ML INJECTION
30 mg/mL (1 mL) | INTRAMUSCULAR | Status: DC | PRN
Start: 2015-05-19 — End: 2015-05-19
  Administered 2015-05-19: 18:00:00 via INTRAVENOUS

## 2015-05-19 MED ORDER — BUPIVACAINE-EPINEPHRINE (PF) 0.25 %-1:200,000 IJ SOLN
0.25 %-1:200,000 | INTRAMUSCULAR | Status: DC | PRN
Start: 2015-05-19 — End: 2015-05-19
  Administered 2015-05-19: 17:00:00 via SUBCUTANEOUS

## 2015-05-19 MED ORDER — GLYCOPYRROLATE 0.2 MG/ML IJ SOLN
0.2 mg/mL | INTRAMUSCULAR | Status: DC | PRN
Start: 2015-05-19 — End: 2015-05-19
  Administered 2015-05-19: 18:00:00 via INTRAVENOUS

## 2015-05-19 MED ORDER — PROPOFOL 10 MG/ML IV EMUL
10 mg/mL | INTRAVENOUS | Status: AC
Start: 2015-05-19 — End: ?

## 2015-05-19 MED ORDER — LACTATED RINGERS IV
INTRAVENOUS | Status: DC
Start: 2015-05-19 — End: 2015-05-19
  Administered 2015-05-19 (×2): via INTRAVENOUS

## 2015-05-19 MED ORDER — SUCCINYLCHOLINE CHLORIDE 20 MG/ML INJECTION
20 mg/mL | INTRAMUSCULAR | Status: DC | PRN
Start: 2015-05-19 — End: 2015-05-19
  Administered 2015-05-19: 17:00:00 via INTRAVENOUS

## 2015-05-19 MED ORDER — NEOSTIGMINE METHYLSULFATE 1 MG/ML INJECTION
1 mg/mL | INTRAMUSCULAR | Status: AC
Start: 2015-05-19 — End: ?

## 2015-05-19 MED ORDER — BUPIVACAINE-EPINEPHRINE (PF) 0.25 %-1:200,000 IJ SOLN
0.25 %-1:200,000 | INTRAMUSCULAR | Status: AC
Start: 2015-05-19 — End: ?

## 2015-05-19 MED ORDER — NEOSTIGMINE METHYLSULFATE 1 MG/ML INJECTION
1 mg/mL | INTRAMUSCULAR | Status: DC | PRN
Start: 2015-05-19 — End: 2015-05-19
  Administered 2015-05-19: 18:00:00 via INTRAVENOUS

## 2015-05-19 MED ORDER — PROPOFOL 10 MG/ML IV EMUL
10 mg/mL | INTRAVENOUS | Status: DC | PRN
Start: 2015-05-19 — End: 2015-05-19
  Administered 2015-05-19: 17:00:00 via INTRAVENOUS

## 2015-05-19 MED ORDER — HYDROMORPHONE (PF) 2 MG/ML IJ SOLN
2 mg/mL | INTRAMUSCULAR | Status: DC | PRN
Start: 2015-05-19 — End: 2015-05-19

## 2015-05-19 MED ORDER — LIDOCAINE HCL 1 % (10 MG/ML) IJ SOLN
10 mg/mL (1 %) | INTRAMUSCULAR | Status: DC | PRN
Start: 2015-05-19 — End: 2015-05-19
  Administered 2015-05-19: 16:00:00 via SUBCUTANEOUS

## 2015-05-19 MED ORDER — GLYCOPYRROLATE 0.2 MG/ML IJ SOLN
0.2 mg/mL | INTRAMUSCULAR | Status: AC
Start: 2015-05-19 — End: ?

## 2015-05-19 MED FILL — NEOSTIGMINE METHYLSULFATE 1 MG/ML INJECTION: 1 mg/mL | INTRAMUSCULAR | Qty: 10

## 2015-05-19 MED FILL — PROPOFOL 10 MG/ML IV EMUL: 10 mg/mL | INTRAVENOUS | Qty: 20

## 2015-05-19 MED FILL — ONDANSETRON (PF) 4 MG/2 ML INJECTION: 4 mg/2 mL | INTRAMUSCULAR | Qty: 2

## 2015-05-19 MED FILL — IC GREEN 25 MG SOLUTION FOR INJECTION: 25 mg | INTRAMUSCULAR | Qty: 1

## 2015-05-19 MED FILL — FENTANYL CITRATE (PF) 50 MCG/ML IJ SOLN: 50 mcg/mL | INTRAMUSCULAR | Qty: 2

## 2015-05-19 MED FILL — DEXAMETHASONE SODIUM PHOSPHATE 10 MG/ML IJ SOLN: 10 mg/mL | INTRAMUSCULAR | Qty: 1

## 2015-05-19 MED FILL — ROCURONIUM 10 MG/ML IV: 10 mg/mL | INTRAVENOUS | Qty: 5

## 2015-05-19 MED FILL — SENSORCAINE-MPF/EPINEPHRINE 0.25 %-1:200,000 INJECTION SOLUTION: 0.25 %-1:200,000 | INTRAMUSCULAR | Qty: 30

## 2015-05-19 MED FILL — QUELICIN 20 MG/ML INJECTION SOLUTION: 20 mg/mL | INTRAMUSCULAR | Qty: 10

## 2015-05-19 MED FILL — CEFAZOLIN 2 GRAM/50 ML NS IVPB: INTRAVENOUS | Qty: 50

## 2015-05-19 MED FILL — GLYCOPYRROLATE 0.2 MG/ML IJ SOLN: 0.2 mg/mL | INTRAMUSCULAR | Qty: 2

## 2015-05-19 MED FILL — KETOROLAC TROMETHAMINE 30 MG/ML INJECTION: 30 mg/mL (1 mL) | INTRAMUSCULAR | Qty: 1

## 2015-05-19 MED FILL — LIDOCAINE (PF) 20 MG/ML (2 %) IV SYRINGE: 100 mg/5 mL (2 %) | INTRAVENOUS | Qty: 5

## 2015-05-19 MED FILL — OFIRMEV 1,000 MG/100 ML (10 MG/ML) INTRAVENOUS SOLUTION: 1000 mg/100 mL (10 mg/mL) | INTRAVENOUS | Qty: 100

## 2015-05-19 MED FILL — CONRAY 60 % INJECTION SOLUTION: 60 % | INTRAMUSCULAR | Qty: 50

## 2015-05-19 NOTE — Op Note (Signed)
Operative note  05/19/2015    Gregory LamasSean Morren  [id number]      PREOPERATIVE DIAGNOSIS: Acute cholecystitis     POSTOPERATIVE DIAGNOSIS: Same     NAME OF PROCEDURE: Laparoscopic cholecystectomy.     SURGEON: Jodelle GreenBrian Shyia Fillingim, MD     ASSISTANT: None    ANESTHESIA: General.     BLOOD LOSS: 50 mL.     SPECIMENS: Gallbladder to pathology.     ASSISTANTS: None.     BRIEF DESCRIPTION OF THE FINDINGS: Whitish discoloration of gallbladder.  Mild adhesions to omental fat.  Moderate tissue edema surrounding the gallbladder, consistent with acute inflammation.  Critical view of safety obtained.  Thick black bile in gallbladder.      COMPLICATIONS: None.     BRIEF HISTORY: 44 year old man presented to the ED complaining of abdominal pain.  Labs unremarkable except mild leukocytosis.  Ultrasound showed gallstones.  LFTs were normal.  His pain was controlled with pain medications and he was discharged home from the ED with operative intervention planned for today.  Repeat labs today showed normalization of leukocytosis and normal LFTs.    PROCEDURE: The patient was evaluated in the preoperative holding area.  We reviewed the planned procedure.  We reviewed the risks of bleeding, infection, abscess, bile leak, damage to the common duct, stenosis of the common duct, retained stones, need for further procedures, pancreatitis, hernia, damage to surround structures including stomach, liver, pancreas, duodenum, colon, and small bowel.    The patient was brought to the operating room and placed supine on the operating room table. General anesthesia was administered. His abdomen was prepped and draped in a sterile fashion. He received preoperative antibiotics, heparin, and SCDs. A supraumbilical incision was made and blunt dissection was carried down to the fascia. The fascia was grasped with 2 Kochers, elevated, and incised sharply with Metzenbaum scissors. Stay sutures were placed on either side of this incision. The peritoneum was entered  bluntly. The Hasson trocar was placed and the abdomen was insufflated with carbon dioxide. The patient was placed head-up and right-side-up. Under direct visualization, a 5 mm epigastric port was placed followed by two 5-mm right lateral abdominal ports. The gallbladder was grasped and elevated. The fundus was pulled up over the liver and the infundibulum grasped and pulled out, exposing the area of the triangle of Calot.    I began by scoring the peritoneum around the gallbladder to release it from the liver surface and to minimize bleeding from tearing off of the liver. I carefully dissected around the triangle of Calot using a combination of blunt dissection and electrocautery. The cystic duct and artery were identified.  They were traced up to the base of the gallbladder. The critical view of safety was obtained and documented.   The cystic duct was triply clipped and cut.  Similarly, the cystic artery was triply clipped and cut.  The gallbladder was taken off of the liver using electrocautery. The gallbladder was placed in an Endobag.     The liver surface was cauterized to control any bleeding. The liver surface and right upper quadrant were irrigated with sterile saline to the return of clear fluid. The clips were reevaluated and were in place and there was hemostasis in the triangle of Calot. The liver surface was hemostatic. All fluid was evacuated. The 5 mm ports were removed under direct visualization and there was no bleeding. The Hasson trocar was removed and the gallbladder removed through the umbilical port. The fascia of  the umbilical port was closed with interrupted 0 Vicryl sutures. Subcutaneous tissues were irrigated. The skin was closed with subcuticular 4-0 Monocryl suture. Dermabond was applied. Marcaine 0.25%, 30 mL, with epinephrine was instilled into the skin and subcutaneous tissues for postoperative analgesia. The patient was awakened, extubated, and taken to recovery in stable condition.  Sponge, instrument, and needle counts were correct.      Kasandra Knudsen, MD  05/19/2015  2:30 PM

## 2015-05-19 NOTE — Interval H&P Note (Signed)
H&P Update:  Gregory Hines was seen and examined.  History and physical has been reviewed. The patient has been examined. There have been no significant clinical changes since the completion of the originally dated History and Physical.    Signed By: Kasandra Knudsen, MD     May 19, 2015 12:35 PM

## 2015-05-19 NOTE — Other (Signed)
Discharge instructions reviewed; verbalized understanding; signed hard copy on chart.

## 2015-05-19 NOTE — Anesthesia Post-Procedure Evaluation (Signed)
Post-Anesthesia Evaluation and Assessment    Patient: Gregory Hines MRN: [id number]  SSN: [id number]    Date of Birth: 1970/11/20  Age: 44 y.o.  Sex: male       Cardiovascular Function/Vital Signs  Visit Vitals   ??? BP 137/81   ??? Pulse (!) 58   ??? Temp 36.8 ??C (98.2 ??F)   ??? Resp 16   ??? Ht 5\' 9"  (1.753 m)   ??? Wt 81.6 kg (180 lb)   ??? SpO2 100%   ??? BMI 26.58 kg/m2       Patient is status post general anesthesia for Procedure(s):  CHOLECYSTECTOMY LAPAROSCOPIC .    Nausea/Vomiting: None    Postoperative hydration reviewed and adequate.    Pain:  Pain Scale 1: Numeric (0 - 10) (05/19/15 1459)  Pain Intensity 1: 2 (05/19/15 1459)   Managed    Neurological Status:   Neuro (WDL): Exceptions to WDL (05/19/15 1429)  Neuro  Neurologic State: Drowsy (05/19/15 1429)  Orientation Level: Oriented to person (05/19/15 1434)   At baseline    Mental Status and Level of Consciousness: Alert and oriented     Pulmonary Status:   O2 Device: Room air (05/19/15 1459)   Adequate oxygenation and airway patent    Complications related to anesthesia: None    Post-anesthesia assessment completed. No concerns    Signed By: Neta Mends, MD     May 19, 2015

## 2015-05-19 NOTE — Op Note (Signed)
Operative note  05/19/2015    Gregory Hines  [id number]      PREOPERATIVE DIAGNOSIS: Acute cholecystitis     POSTOPERATIVE DIAGNOSIS: Same     NAME OF PROCEDURE: Laparoscopic cholecystectomy.     SURGEON: Jodelle Green, MD     ASSISTANT: None    ANESTHESIA: General.     BLOOD LOSS: 50 mL.     SPECIMENS: Gallbladder to pathology.     ASSISTANTS: None.     BRIEF DESCRIPTION OF THE FINDINGS: Whitish discoloration of gallbladder.  Mild adhesions to omental fat.  Moderate tissue edema surrounding the gallbladder, consistent with acute inflammation.  Critical view of safety obtained.  Thick black bile in gallbladder.      COMPLICATIONS: None.     BRIEF HISTORY: 44 year old man presented to the ED complaining of abdominal pain.  Labs unremarkable except mild leukocytosis.  Ultrasound showed gallstones.  LFTs were normal.  His pain was controlled with pain medications and he was discharged home from the ED with operative intervention planned for today.  Repeat labs today showed normalization of leukocytosis and normal LFTs.    PROCEDURE: The patient was evaluated in the preoperative holding area.  We reviewed the planned procedure.  We reviewed the risks of bleeding, infection, abscess, bile leak, damage to the common duct, stenosis of the common duct, retained stones, need for further procedures, pancreatitis, hernia, damage to surround structures including stomach, liver, pancreas, duodenum, colon, and small bowel.    The patient was brought to the operating room and placed supine on the operating room table. General anesthesia was administered. His abdomen was prepped and draped in a sterile fashion. He received preoperative antibiotics, heparin, and SCDs. A supraumbilical incision was made and blunt dissection was carried down to the fascia. The fascia was grasped with 2 Kochers, elevated, and incised sharply with Metzenbaum scissors. Stay sutures were placed on either side of this incision. The peritoneum  was entered bluntly. The Hasson trocar was placed and the abdomen was insufflated with carbon dioxide. The patient was placed head-up and right-side-up. Under direct visualization, a 5 mm epigastric port was placed followed by two 5-mm right lateral abdominal ports. The gallbladder was grasped and elevated. The fundus was pulled up over the liver and the infundibulum grasped and pulled out, exposing the area of the triangle of Calot.    I began by scoring the peritoneum around the gallbladder to release it from the liver surface and to minimize bleeding from tearing off of the liver. I carefully dissected around the triangle of Calot using a combination of blunt dissection and electrocautery. The cystic duct and artery were identified.  They were traced up to the base of the gallbladder. The critical view of safety was obtained and documented.   The cystic duct was triply clipped and cut.  Similarly, the cystic artery was triply clipped and cut.  The gallbladder was taken off of the liver using electrocautery. The gallbladder was placed in an Endobag.     The liver surface was cauterized to control any bleeding. The liver surface and right upper quadrant were irrigated with sterile saline to the return of clear fluid. The clips were reevaluated and were in place and there was hemostasis in the triangle of Calot. The liver surface was hemostatic. All fluid was evacuated. The 5 mm ports were removed under direct visualization and there was no bleeding. The Hasson trocar was removed and the gallbladder removed through the umbilical port. The fascia of  the umbilical port was closed with interrupted 0 Vicryl sutures. Subcutaneous tissues were irrigated. The skin was closed with subcuticular 4-0 Monocryl suture. Dermabond was applied. Marcaine 0.25%, 30 mL, with epinephrine was instilled into the skin and subcutaneous tissues for postoperative analgesia. The patient was awakened, extubated, and taken to  recovery in stable condition. Sponge, instrument, and needle counts were correct.      Kasandra Knudsen, MD  05/19/2015  2:30 PM

## 2015-05-19 NOTE — Anesthesia Pre-Procedure Evaluation (Signed)
Anesthetic History   No history of anesthetic complications            Review of Systems / Medical History  Patient summary reviewed and pertinent labs reviewed    Pulmonary          Smoker         Neuro/Psych   Within defined limits           Cardiovascular                  Exercise tolerance: >4 METS  Comments: Denies CV history   GI/Hepatic/Renal  Within defined limits              Endo/Other  Within defined limits           Other Findings              Physical Exam    Airway  Mallampati: II  TM Distance: 4 - 6 cm  Neck ROM: normal range of motion   Mouth opening: Normal     Cardiovascular    Rhythm: regular  Rate: normal         Dental         Pulmonary  Breath sounds clear to auscultation               Abdominal  GI exam deferred       Other Findings            Anesthetic Plan    ASA: 2  Anesthesia type: general          Induction: Intravenous  Anesthetic plan and risks discussed with: Patient and Spouse

## 2015-05-20 MED FILL — NEOSTIGMINE METHYLSULFATE 1 MG/ML INJECTION: 1 mg/mL | INTRAMUSCULAR | Qty: 3

## 2015-05-20 MED FILL — ROCURONIUM 10 MG/ML IV: 10 mg/mL | INTRAVENOUS | Qty: 35

## 2015-05-20 MED FILL — PROPOFOL 10 MG/ML IV EMUL: 10 mg/mL | INTRAVENOUS | Qty: 200

## 2015-05-20 MED FILL — QUELICIN 20 MG/ML INJECTION SOLUTION: 20 mg/mL | INTRAMUSCULAR | Qty: 160

## 2015-05-20 MED FILL — DEXAMETHASONE SODIUM PHOSPHATE 10 MG/ML IJ SOLN: 10 mg/mL | INTRAMUSCULAR | Qty: 10

## 2015-05-20 MED FILL — GLYCOPYRROLATE 0.2 MG/ML IJ SOLN: 0.2 mg/mL | INTRAMUSCULAR | Qty: 0.4

## 2015-05-20 MED FILL — LIDOCAINE (PF) 20 MG/ML (2 %) IJ SOLN: 20 mg/mL (2 %) | INTRAMUSCULAR | Qty: 75

## 2015-05-20 MED FILL — ONDANSETRON (PF) 4 MG/2 ML INJECTION: 4 mg/2 mL | INTRAMUSCULAR | Qty: 4

## 2015-05-20 MED FILL — KETOROLAC TROMETHAMINE 30 MG/ML INJECTION: 30 mg/mL (1 mL) | INTRAMUSCULAR | Qty: 1

## 2015-06-10 ENCOUNTER — Encounter: Payer: MEDICAID | Attending: Surgery

## 2015-06-11 NOTE — Telephone Encounter (Signed)
Called about RTW message. The patient's wife told me that Marchelle Folks already took care of .

## 2015-06-18 ENCOUNTER — Ambulatory Visit: Payer: MEDICAID | Attending: Surgery

## 2015-06-18 NOTE — Progress Notes (Signed)
Kasandra Knudsen, M.D.   Colon and Rectal Surgeon  [facility]  [address], [address]  [place], [place] [address]  Phone: [phone] Fax: [phone]      Gregory Hines did not show up for his follow up appointment today.  He had previously been seen for cholecystitis and underwent lap cholecystectomy 05/2015.  He was scheduled for postop follow up today.   We had called earlier in the week to confirm the appointment and left a message.   The patient did not call or cancel today's appointment.  We will try to contact him and work to reschedule him in the future.    Jodelle Green and Staff  Colon and Rectal Surgery  [employer]  [phone]

## 2017-10-13 DIAGNOSIS — S40022A Contusion of left upper arm, initial encounter: Secondary | ICD-10-CM | POA: Diagnosis not present

## 2017-10-13 DIAGNOSIS — R21 Rash and other nonspecific skin eruption: Secondary | ICD-10-CM | POA: Diagnosis not present

## 2017-10-13 DIAGNOSIS — S40021A Contusion of right upper arm, initial encounter: Secondary | ICD-10-CM | POA: Diagnosis not present

## 2017-10-13 DIAGNOSIS — X58XXXA Exposure to other specified factors, initial encounter: Secondary | ICD-10-CM | POA: Diagnosis not present

## 2017-10-15 DIAGNOSIS — S60921A Unspecified superficial injury of right hand, initial encounter: Secondary | ICD-10-CM | POA: Diagnosis not present

## 2017-10-15 DIAGNOSIS — S60911A Unspecified superficial injury of right wrist, initial encounter: Secondary | ICD-10-CM | POA: Diagnosis not present

## 2017-10-15 DIAGNOSIS — S60912A Unspecified superficial injury of left wrist, initial encounter: Secondary | ICD-10-CM | POA: Diagnosis not present

## 2017-10-15 DIAGNOSIS — S60922A Unspecified superficial injury of left hand, initial encounter: Secondary | ICD-10-CM | POA: Diagnosis not present

## 2017-10-25 DIAGNOSIS — R062 Wheezing: Secondary | ICD-10-CM | POA: Diagnosis not present

## 2017-10-25 DIAGNOSIS — S60921A Unspecified superficial injury of right hand, initial encounter: Secondary | ICD-10-CM | POA: Diagnosis not present

## 2017-10-25 DIAGNOSIS — J209 Acute bronchitis, unspecified: Secondary | ICD-10-CM | POA: Diagnosis not present

## 2017-10-25 DIAGNOSIS — S60911A Unspecified superficial injury of right wrist, initial encounter: Secondary | ICD-10-CM | POA: Diagnosis not present

## 2017-10-31 ENCOUNTER — Other Ambulatory Visit: Payer: Self-pay

## 2017-10-31 ENCOUNTER — Emergency Department (HOSPITAL_COMMUNITY)
Admission: EM | Admit: 2017-10-31 | Discharge: 2017-10-31 | Disposition: A | Discharge status: Home / Self Care | Payer: BLUE CROSS/BLUE SHIELD | Attending: Emergency Medicine | Admitting: Emergency Medicine

## 2017-10-31 ENCOUNTER — Emergency Department (HOSPITAL_COMMUNITY): Payer: BLUE CROSS/BLUE SHIELD

## 2017-10-31 ENCOUNTER — Encounter (HOSPITAL_COMMUNITY): Payer: Self-pay | Admitting: *Deleted

## 2017-10-31 DIAGNOSIS — Z7982 Long term (current) use of aspirin: Secondary | ICD-10-CM | POA: Insufficient documentation

## 2017-10-31 DIAGNOSIS — R0789 Other chest pain: Secondary | ICD-10-CM | POA: Diagnosis not present

## 2017-10-31 DIAGNOSIS — S40029A Contusion of unspecified upper arm, initial encounter: Secondary | ICD-10-CM | POA: Diagnosis not present

## 2017-10-31 DIAGNOSIS — F17210 Nicotine dependence, cigarettes, uncomplicated: Secondary | ICD-10-CM | POA: Insufficient documentation

## 2017-10-31 DIAGNOSIS — R233 Spontaneous ecchymoses: Secondary | ICD-10-CM | POA: Insufficient documentation

## 2017-10-31 DIAGNOSIS — R079 Chest pain, unspecified: Secondary | ICD-10-CM

## 2017-10-31 DIAGNOSIS — R238 Other skin changes: Secondary | ICD-10-CM | POA: Insufficient documentation

## 2017-10-31 LAB — CBC
HCT: 48.3 % (ref 39.0–52.0)
Hemoglobin: 16.9 g/dL (ref 13.0–17.0)
MCH: 33.7 pg (ref 26.0–34.0)
MCHC: 35 g/dL (ref 30.0–36.0)
MCV: 96.4 fL (ref 78.0–100.0)
PLATELETS: 289 10*3/uL (ref 150–400)
RBC: 5.01 MIL/uL (ref 4.22–5.81)
RDW: 12.3 % (ref 11.5–15.5)
WBC: 11 10*3/uL — ABNORMAL HIGH (ref 4.0–10.5)

## 2017-10-31 LAB — COMPREHENSIVE METABOLIC PANEL
ALBUMIN: 4.2 g/dL (ref 3.5–5.0)
ALK PHOS: 63 U/L (ref 38–126)
ALT: 18 U/L (ref 17–63)
AST: 21 U/L (ref 15–41)
Anion gap: 11 (ref 5–15)
BILIRUBIN TOTAL: 0.6 mg/dL (ref 0.3–1.2)
BUN: 15 mg/dL (ref 6–20)
CALCIUM: 9.8 mg/dL (ref 8.9–10.3)
CO2: 22 mmol/L (ref 22–32)
CREATININE: 0.96 mg/dL (ref 0.61–1.24)
Chloride: 106 mmol/L (ref 101–111)
GFR calc Af Amer: >60 mL/min (ref >60)
GFR calc non Af Amer: >60 mL/min (ref >60)
GLUCOSE: 88 mg/dL (ref 65–99)
Potassium: 4 mmol/L (ref 3.5–5.1)
SODIUM: 139 mmol/L (ref 135–145)
TOTAL PROTEIN: 6.9 g/dL (ref 6.5–8.1)

## 2017-10-31 LAB — URINALYSIS, ROUTINE W REFLEX MICROSCOPIC
BILIRUBIN URINE: NEGATIVE
Bacteria, UA: NONE SEEN
Glucose, UA: NEGATIVE mg/dL
Ketones, ur: NEGATIVE mg/dL
Leukocytes, UA: NEGATIVE
NITRITE: NEGATIVE
PH: 6 (ref 5.0–8.0)
Protein, ur: NEGATIVE mg/dL
SPECIFIC GRAVITY, URINE: 1.008 (ref 1.005–1.030)
Squamous Epithelial / LPF: NONE SEEN

## 2017-10-31 LAB — I-STAT TROPONIN, ED
TROPONIN I, POC: 0.01 ng/mL (ref 0.00–0.08)
Troponin i, poc: 0 ng/mL (ref 0.00–0.08)

## 2017-10-31 LAB — LIPASE, BLOOD: Lipase: 43 U/L (ref 11–51)

## 2017-10-31 NOTE — ED Notes (Signed)
ED Provider at bedside. 

## 2017-10-31 NOTE — ED Notes (Signed)
Patient transported to X-ray 

## 2017-10-31 NOTE — ED Provider Notes (Signed)
[facility]ENC[facility] Provider Note   CSN: [id number] Arrival date & time: 10/31/17  1719     History   Chief Complaint Chief Complaint  Patient presents with  . Chest Pain  . Headache  . Abnormal Lab    HPI Tom Sampson is a 47 y.o. male.  HPI Pt has been having pain in the left side of his chest and back.     Pt also has noticed intermittent bruising in the past.  He was seen at [facility].  He was instructed he should see a hematologist.  Pt was referred by his primary care doctor and was referred to a hematologist.  Pt states he saw the hematologist today.  They asked him about his aspirin use.  Pt told him he was taking a lot aspirin and he was told he needed to get that checked today.  FMHX of heart disease.  Brother had heart disease.  Pt smokes cigarettes.   History reviewed. No pertinent past medical history.  There are no active problems to display for this patient.   Past Surgical History:  Procedure Laterality Date  . CHOLECYSTECTOMY         Home Medications    Prior to Admission medications   Medication Sig Start Date End Date Taking? Authorizing Provider  ibuprofen (ADVIL,MOTRIN) 200 MG tablet Take 600 mg by mouth every 6 (six) hours as needed.   Yes [provider]    Family History History reviewed. No pertinent family history.  Social History Social History   Tobacco Use  . Smoking status: Current Every Day Smoker  Substance Use Topics  . Alcohol use: No    Frequency: Never  . Drug use: No     Allergies   Patient has no known allergies.   Review of Systems Review of Systems  All other systems reviewed and are negative.    Physical Exam Updated Vital Signs BP 132/87   Pulse (!) 54   Temp 99.2 F (37.3 C) (Oral)   Resp 15   SpO2 97%   Physical Exam  Constitutional: He appears well-developed and well-nourished. No distress.  HENT:  Head: Normocephalic and atraumatic.    Right Ear: External ear normal.  Left Ear: External ear normal.  Eyes: Conjunctivae are normal. Right eye exhibits no discharge. Left eye exhibits no discharge. No scleral icterus.  Neck: Neck supple. No tracheal deviation present.  Cardiovascular: Normal rate, regular rhythm and intact distal pulses.  Pulmonary/Chest: Effort normal and breath sounds normal. No stridor. No respiratory distress. He has no wheezes. He has no rales.  Abdominal: Soft. Bowel sounds are normal. He exhibits no distension. There is no tenderness. There is no rebound and no guarding.  Musculoskeletal: He exhibits no edema or tenderness.  Neurological: He is alert. He has normal strength. No cranial nerve deficit (no facial droop, extraocular movements intact, no slurred speech) or sensory deficit. He exhibits normal muscle tone. He displays no seizure activity. Coordination normal.  Skin: Skin is warm and dry. No rash noted.  Few small areas of bruising on the arms,   Psychiatric: He has a normal mood and affect.  Nursing note and vitals reviewed.    ED Treatments / Results  Labs (all labs ordered are listed, but only abnormal results are displayed) Labs Reviewed  CBC - Abnormal; Notable for the following components:      Result Value   WBC 11.0 (*)    All other components within  normal limits  URINALYSIS, ROUTINE W REFLEX MICROSCOPIC - Abnormal; Notable for the following components:   Color, Urine STRAW (*)    Hgb urine dipstick SMALL (*)    All other components within normal limits  LIPASE, BLOOD  COMPREHENSIVE METABOLIC PANEL  I-STAT TROPONIN, ED  I-STAT TROPONIN, ED    EKG  EKG Interpretation  Date/Time:  Wednesday October 31 2017 22:27:10 EST Ventricular Rate:  58 PR Interval:    QRS Duration: 113 QT Interval:  405 QTC Calculation: 398 R Axis:   14 Text Interpretation:  Sinus rhythm Borderline intraventricular conduction delay ST elev, probable normal early repol pattern No old tracing to  compare Confirmed by Linwood Dibbles [phone]) on 10/31/2017 10:32:20 PM       Radiology Dg Chest 2 View  Result Date: 10/31/2017 CLINICAL DATA:  Rash on the left hand and left-sided chest pain for 2 weeks. After blood work at [facility] today, patient was told that he was in liver and kidney failure. Current smoker. EXAM: CHEST  2 VIEW COMPARISON:  11/16/2016 FINDINGS: The heart size and mediastinal contours are within normal limits. Both lungs are clear. The visualized skeletal structures are unremarkable. Surgical clips in the right upper quadrant. IMPRESSION: No active cardiopulmonary disease. Electronically Signed   By: Burman Nieves M.D.   On: 10/31/2017 22:36    Procedures Procedures (including critical care time)  Medications Ordered in ED Medications - No data to display   Initial Impression / Assessment and Plan / ED Course  I have reviewed the triage vital signs and the nursing notes.  Pertinent labs & imaging results that were available during my care of the patient were reviewed by me and considered in my medical decision making (see chart for details).   Pt presents with a variety of complaints.  One of his concerns was to assess his kidney and liver tests.  Both of those are normal.  Pt also complained of chest pain.  He has a family history and smokes but otherwise no risk factors.  Heart score is 3.  Serial cardiac enzymes are normal.  Doubt acs, pe or other emergent etiology.   Final Clinical Impressions(s) / ED Diagnoses   Final diagnoses:  Chest pain, unspecified type    ED Discharge Orders    None       Linwood Dibbles, MD 10/31/17 2311

## 2017-10-31 NOTE — Discharge Instructions (Signed)
Follow up with your primary care doctor, return as needed for worsening symptoms °

## 2017-10-31 NOTE — ED Triage Notes (Signed)
Pt reports having occ left side chest pains and headaches. Has noticed recent rash to his hands and arms, went to [place] hospital and was told to see a hematologist due to abnormal labs. Also reports taking ibuprofen frequently for pain.

## 2017-11-01 DIAGNOSIS — I209 Angina pectoris, unspecified: Secondary | ICD-10-CM | POA: Diagnosis not present

## 2017-11-06 DIAGNOSIS — G44209 Tension-type headache, unspecified, not intractable: Secondary | ICD-10-CM | POA: Diagnosis not present

## 2017-11-06 DIAGNOSIS — R51 Headache: Secondary | ICD-10-CM | POA: Diagnosis not present

## 2017-11-06 DIAGNOSIS — M4802 Spinal stenosis, cervical region: Secondary | ICD-10-CM | POA: Diagnosis not present

## 2017-11-06 DIAGNOSIS — R079 Chest pain, unspecified: Secondary | ICD-10-CM | POA: Diagnosis not present

## 2017-11-16 DIAGNOSIS — M47816 Spondylosis without myelopathy or radiculopathy, lumbar region: Secondary | ICD-10-CM | POA: Diagnosis not present

## 2017-11-16 DIAGNOSIS — M545 Low back pain: Secondary | ICD-10-CM | POA: Diagnosis not present

## 2017-11-16 DIAGNOSIS — Z6834 Body mass index (BMI) 34.0-34.9, adult: Secondary | ICD-10-CM | POA: Diagnosis not present

## 2017-11-16 DIAGNOSIS — M546 Pain in thoracic spine: Secondary | ICD-10-CM | POA: Diagnosis not present

## 2017-11-16 DIAGNOSIS — R0781 Pleurodynia: Secondary | ICD-10-CM | POA: Diagnosis not present

## 2017-11-20 ENCOUNTER — Encounter: Payer: Self-pay | Admitting: *Deleted

## 2017-11-23 DIAGNOSIS — M545 Low back pain: Secondary | ICD-10-CM | POA: Diagnosis not present

## 2017-11-23 DIAGNOSIS — M542 Cervicalgia: Secondary | ICD-10-CM | POA: Diagnosis not present

## 2017-11-23 NOTE — Progress Notes (Signed)
Cardiology Office Note   Date:  11/28/2017   ID:  Tom Sampson, DOB 01-Apr-1971, MRN [id number]  PCP:  Baldo Ash FNP  Cardiologist:   Louvina Cleary [place], MD   Chief Complaint  Patient presents with  . Chest Pain      History of Present Illness: Tom Sampson is a 47 y.o. male who is seen at the request of Baldo Ash FNP for evaluation of chest pain. He has a history of tobacco abuse and family history of CAD. He reports he has taken Advil for years from aches and pains. Was taking up  To 21 tablets daily. Noted increased bruising and it was recommended that he stop taking Advil. Was switched to meloxicam twice a day. He complains of pain in left lateral rib cage radiating to his left flank. Notes dyspnea on exertion. No history of DM or HTN. Doesn't know cholesterol status. Works in [employer] and is inactive.    Past Medical History:  Diagnosis Date  . Skin disorder     Past Surgical History:  Procedure Laterality Date  . CHOLECYSTECTOMY    . right knee surgery       Current Outpatient Medications  Medication Sig Dispense Refill  . cyclobenzaprine (FLEXERIL) 10 MG tablet Take 1 tablet by mouth every 8 (eight) hours.  0  . meloxicam (MOBIC) 7.5 MG tablet Take 1 tablet by mouth every 12 (twelve) hours.  0   No current facility-administered medications for this visit.     Allergies:   Patient has no known allergies.    Social History:  The patient  reports that he has been smoking cigarettes.  He has been smoking about 0.50 packs per day. he has never used smokeless tobacco. He reports that he does not drink alcohol or use drugs.   Family History:  The patient's family history includes Arrhythmia in his father; Diabetes in his father, mother, and sister; Heart attack in his brother; Heart disease in his brother, maternal uncle, mother, and paternal aunt; Obesity in his sister.    ROS:  Please see the history of present illness.   Otherwise, review of systems  are positive for none.   All other systems are reviewed and negative.    PHYSICAL EXAM: VS:  BP 128/82 (BP Location: Left Arm)   Pulse 60   Ht 5\' 10"  (1.778 m)   Wt 225 lb 6.4 oz (102.2 kg)   BMI 32.34 kg/m  , BMI Body mass index is 32.34 kg/m. GEN: Well nourished, well developed, in no acute distress  HEENT: normal  Neck: no JVD, carotid bruits, or masses Cardiac: RRR; no murmurs, rubs, or gallops,no edema  Respiratory:  clear to auscultation bilaterally, normal work of breathing GI: soft, nontender, nondistended, + BS MS: no deformity or atrophy . Pulses 2+ equal. No edema. Skin: warm and dry, no rash Neuro:  Strength and sensation are intact Psych: euthymic mood, full affect   EKG:  EKG is ordered today. The ekg ordered today demonstrates NSR with normal Ecg. I have personally reviewed and interpreted this study.    Recent Labs: 10/31/2017: ALT 18; BUN 15; Creatinine, Ser 0.96; Hemoglobin 16.9; Platelets 289; Potassium 4.0; Sodium 139    Lipid Panel No results found for: CHOL, TRIG, HDL, CHOLHDL, VLDL, LDLCALC, LDLDIRECT    Wt Readings from Last 3 Encounters:  11/28/17 225 lb 6.4 oz (102.2 kg)      Other studies Reviewed: Additional studies/ records that were reviewed today  include:   Labs dated 10/31/17; normal CBC and chemistries.    ASSESSMENT AND PLAN:  1.  Atypical left rib and flank pain. Most c/w musculoskeletal pain. 2. Dyspnea on exertion. Likely related to chronic tobacco abuse. He does have a strong family history of CAD so will go ahead and arrange for a ETT. If normal recommend risk factor modification 3. Tobacco abuse. Recommend smoking cessation. 4. Easy bruisability. Normal plt count and Hgb. Suspect related to high dose Advil therapy.  5. ? Lipid status. Will check fasting lipid panel today.   Current medicines are reviewed at length with the patient today.  The patient does not have concerns regarding medicines.  The following changes have  been made:  no change  Labs/ tests ordered today include: ETT, lipid panel.   Signed, Eriana Suliman [place], MD  11/28/2017 9:09 AM    [facility] [address], [place], [place], [id number] Phone [phone], Fax [phone]

## 2017-11-28 ENCOUNTER — Encounter: Payer: Self-pay | Admitting: Cardiology

## 2017-11-28 ENCOUNTER — Ambulatory Visit (INDEPENDENT_AMBULATORY_CARE_PROVIDER_SITE_OTHER): Payer: BLUE CROSS/BLUE SHIELD | Admitting: Cardiology

## 2017-11-28 ENCOUNTER — Telehealth: Payer: Self-pay

## 2017-11-28 VITALS — BP 128/82 | HR 60 | Ht 70.0 in | Wt 225.4 lb

## 2017-11-28 DIAGNOSIS — L989 Disorder of the skin and subcutaneous tissue, unspecified: Secondary | ICD-10-CM | POA: Insufficient documentation

## 2017-11-28 DIAGNOSIS — R0789 Other chest pain: Secondary | ICD-10-CM | POA: Diagnosis not present

## 2017-11-28 DIAGNOSIS — Z720 Tobacco use: Secondary | ICD-10-CM | POA: Diagnosis not present

## 2017-11-28 DIAGNOSIS — R079 Chest pain, unspecified: Secondary | ICD-10-CM | POA: Diagnosis not present

## 2017-11-28 DIAGNOSIS — R0609 Other forms of dyspnea: Secondary | ICD-10-CM

## 2017-11-28 LAB — LIPID PANEL W/O CHOL/HDL RATIO
Cholesterol, Total: 159 mg/dL (ref 100–199)
HDL: 44 mg/dL (ref >39)
LDL CALC: 95 mg/dL (ref 0–99)
TRIGLYCERIDES: 98 mg/dL (ref 0–149)
VLDL Cholesterol Cal: 20 mg/dL (ref 5–40)

## 2017-11-28 NOTE — Patient Instructions (Signed)
Schedule Treadmill  ( POET )

## 2017-11-28 NOTE — Telephone Encounter (Signed)
Dr.Jordan's 11/28/17 office note faxed to Baldo AshMichael Martin FNP at fax # [phone].

## 2017-12-05 ENCOUNTER — Telehealth (HOSPITAL_COMMUNITY): Payer: Self-pay

## 2017-12-05 NOTE — Telephone Encounter (Signed)
Encounter complete. 

## 2017-12-07 ENCOUNTER — Ambulatory Visit (HOSPITAL_COMMUNITY)
Admission: RE | Admit: 2017-12-07 | Discharge: 2017-12-07 | Disposition: A | Discharge status: Home / Self Care | Payer: BLUE CROSS/BLUE SHIELD | Source: Ambulatory Visit | Attending: Cardiology | Admitting: Cardiology

## 2017-12-07 DIAGNOSIS — M8580 Other specified disorders of bone density and structure, unspecified site: Secondary | ICD-10-CM | POA: Diagnosis not present

## 2017-12-07 DIAGNOSIS — R233 Spontaneous ecchymoses: Secondary | ICD-10-CM | POA: Diagnosis not present

## 2017-12-07 DIAGNOSIS — K5900 Constipation, unspecified: Secondary | ICD-10-CM | POA: Diagnosis not present

## 2017-12-07 DIAGNOSIS — L989 Disorder of the skin and subcutaneous tissue, unspecified: Secondary | ICD-10-CM | POA: Insufficient documentation

## 2017-12-07 DIAGNOSIS — R079 Chest pain, unspecified: Secondary | ICD-10-CM | POA: Diagnosis not present

## 2017-12-07 DIAGNOSIS — M545 Low back pain: Secondary | ICD-10-CM | POA: Diagnosis not present

## 2017-12-07 LAB — EXERCISE TOLERANCE TEST
CHL CUP MPHR: 173 {beats}/min
CSEPED: 6 min
CSEPHR: 89 %
Estimated workload: 8 METS
Exercise duration (sec): 41 s
Peak HR: 155 {beats}/min
RPE: 17
Rest HR: 61 {beats}/min

## 2017-12-14 DIAGNOSIS — M533 Sacrococcygeal disorders, not elsewhere classified: Secondary | ICD-10-CM | POA: Diagnosis not present

## 2017-12-14 DIAGNOSIS — M549 Dorsalgia, unspecified: Secondary | ICD-10-CM | POA: Diagnosis not present

## 2017-12-14 DIAGNOSIS — R238 Other skin changes: Secondary | ICD-10-CM | POA: Diagnosis not present

## 2017-12-14 DIAGNOSIS — M79671 Pain in right foot: Secondary | ICD-10-CM | POA: Diagnosis not present

## 2017-12-14 DIAGNOSIS — M79642 Pain in left hand: Secondary | ICD-10-CM | POA: Diagnosis not present

## 2017-12-14 DIAGNOSIS — M2550 Pain in unspecified joint: Secondary | ICD-10-CM | POA: Diagnosis not present

## 2017-12-14 DIAGNOSIS — M79643 Pain in unspecified hand: Secondary | ICD-10-CM | POA: Diagnosis not present

## 2017-12-14 DIAGNOSIS — M79672 Pain in left foot: Secondary | ICD-10-CM | POA: Diagnosis not present

## 2017-12-14 DIAGNOSIS — R21 Rash and other nonspecific skin eruption: Secondary | ICD-10-CM | POA: Diagnosis not present

## 2017-12-14 DIAGNOSIS — M545 Low back pain: Secondary | ICD-10-CM | POA: Diagnosis not present

## 2017-12-14 DIAGNOSIS — M79641 Pain in right hand: Secondary | ICD-10-CM | POA: Diagnosis not present

## 2017-12-27 DIAGNOSIS — K5900 Constipation, unspecified: Secondary | ICD-10-CM | POA: Diagnosis not present

## 2017-12-27 DIAGNOSIS — M545 Low back pain: Secondary | ICD-10-CM | POA: Diagnosis not present

## 2017-12-28 DIAGNOSIS — M549 Dorsalgia, unspecified: Secondary | ICD-10-CM | POA: Diagnosis not present

## 2017-12-28 DIAGNOSIS — K5900 Constipation, unspecified: Secondary | ICD-10-CM | POA: Diagnosis not present

## 2018-01-04 DIAGNOSIS — Z791 Long term (current) use of non-steroidal anti-inflammatories (NSAID): Secondary | ICD-10-CM | POA: Diagnosis not present

## 2018-01-04 DIAGNOSIS — T148XXA Other injury of unspecified body region, initial encounter: Secondary | ICD-10-CM | POA: Diagnosis not present

## 2018-01-04 DIAGNOSIS — X58XXXA Exposure to other specified factors, initial encounter: Secondary | ICD-10-CM | POA: Diagnosis not present

## 2018-01-04 DIAGNOSIS — L989 Disorder of the skin and subcutaneous tissue, unspecified: Secondary | ICD-10-CM | POA: Diagnosis not present

## 2018-02-14 DIAGNOSIS — R062 Wheezing: Secondary | ICD-10-CM | POA: Diagnosis not present

## 2018-02-14 DIAGNOSIS — J209 Acute bronchitis, unspecified: Secondary | ICD-10-CM | POA: Diagnosis not present

## 2018-02-14 DIAGNOSIS — J309 Allergic rhinitis, unspecified: Secondary | ICD-10-CM | POA: Diagnosis not present

## 2018-02-14 DIAGNOSIS — R05 Cough: Secondary | ICD-10-CM | POA: Diagnosis not present

## 2018-03-25 DIAGNOSIS — J309 Allergic rhinitis, unspecified: Secondary | ICD-10-CM | POA: Diagnosis not present

## 2018-03-25 DIAGNOSIS — J209 Acute bronchitis, unspecified: Secondary | ICD-10-CM | POA: Diagnosis not present

## 2018-03-25 DIAGNOSIS — R062 Wheezing: Secondary | ICD-10-CM | POA: Diagnosis not present

## 2018-03-25 DIAGNOSIS — R05 Cough: Secondary | ICD-10-CM | POA: Diagnosis not present

## 2018-06-08 DIAGNOSIS — R062 Wheezing: Secondary | ICD-10-CM | POA: Diagnosis not present

## 2018-06-08 DIAGNOSIS — J209 Acute bronchitis, unspecified: Secondary | ICD-10-CM | POA: Diagnosis not present

## 2018-06-08 DIAGNOSIS — R05 Cough: Secondary | ICD-10-CM | POA: Diagnosis not present

## 2018-10-18 DIAGNOSIS — J309 Allergic rhinitis, unspecified: Secondary | ICD-10-CM | POA: Diagnosis not present

## 2018-11-14 DIAGNOSIS — J209 Acute bronchitis, unspecified: Secondary | ICD-10-CM | POA: Diagnosis not present

## 2018-11-23 DIAGNOSIS — F17200 Nicotine dependence, unspecified, uncomplicated: Secondary | ICD-10-CM | POA: Diagnosis not present

## 2018-11-23 DIAGNOSIS — J40 Bronchitis, not specified as acute or chronic: Secondary | ICD-10-CM | POA: Diagnosis not present

## 2018-12-01 DIAGNOSIS — J0100 Acute maxillary sinusitis, unspecified: Secondary | ICD-10-CM | POA: Diagnosis not present

## 2018-12-01 DIAGNOSIS — R05 Cough: Secondary | ICD-10-CM | POA: Diagnosis not present

## 2018-12-10 DIAGNOSIS — G8929 Other chronic pain: Secondary | ICD-10-CM | POA: Diagnosis not present

## 2018-12-10 DIAGNOSIS — M545 Low back pain: Secondary | ICD-10-CM | POA: Diagnosis not present

## 2018-12-15 DIAGNOSIS — R05 Cough: Secondary | ICD-10-CM | POA: Diagnosis not present

## 2018-12-15 DIAGNOSIS — J309 Allergic rhinitis, unspecified: Secondary | ICD-10-CM | POA: Diagnosis not present

## 2019-01-21 DIAGNOSIS — M5136 Other intervertebral disc degeneration, lumbar region: Secondary | ICD-10-CM | POA: Diagnosis not present

## 2019-01-21 DIAGNOSIS — M1711 Unilateral primary osteoarthritis, right knee: Secondary | ICD-10-CM | POA: Diagnosis not present

## 2019-01-21 DIAGNOSIS — M1712 Unilateral primary osteoarthritis, left knee: Secondary | ICD-10-CM | POA: Diagnosis not present

## 2019-02-21 DIAGNOSIS — M1711 Unilateral primary osteoarthritis, right knee: Secondary | ICD-10-CM | POA: Diagnosis not present

## 2019-02-21 DIAGNOSIS — M1712 Unilateral primary osteoarthritis, left knee: Secondary | ICD-10-CM | POA: Diagnosis not present

## 2019-02-21 DIAGNOSIS — M5136 Other intervertebral disc degeneration, lumbar region: Secondary | ICD-10-CM | POA: Diagnosis not present

## 2019-03-04 DIAGNOSIS — K1321 Leukoplakia of oral mucosa, including tongue: Secondary | ICD-10-CM | POA: Diagnosis not present

## 2019-04-13 DIAGNOSIS — Z125 Encounter for screening for malignant neoplasm of prostate: Secondary | ICD-10-CM | POA: Diagnosis not present

## 2019-04-13 DIAGNOSIS — D649 Anemia, unspecified: Secondary | ICD-10-CM | POA: Diagnosis not present

## 2019-04-13 DIAGNOSIS — R0609 Other forms of dyspnea: Secondary | ICD-10-CM | POA: Diagnosis not present

## 2019-04-13 DIAGNOSIS — R945 Abnormal results of liver function studies: Secondary | ICD-10-CM | POA: Diagnosis not present

## 2019-04-26 DIAGNOSIS — S61211A Laceration without foreign body of left index finger without damage to nail, initial encounter: Secondary | ICD-10-CM | POA: Diagnosis not present

## 2019-04-26 DIAGNOSIS — W278XXA Contact with other nonpowered hand tool, initial encounter: Secondary | ICD-10-CM | POA: Diagnosis not present

## 2019-05-19 IMAGING — CR DG CHEST 2V
2 series · 2 of 2 positions shown · non-contrast
Comparison: 11/16/2016

CLINICAL DATA: Rash on the left hand and left-sided chest pain for
2 weeks. After blood work at [place] today, patient was told that he
was in liver and kidney failure. Current smoker.

EXAM:
CHEST  2 VIEW

[chest pa]
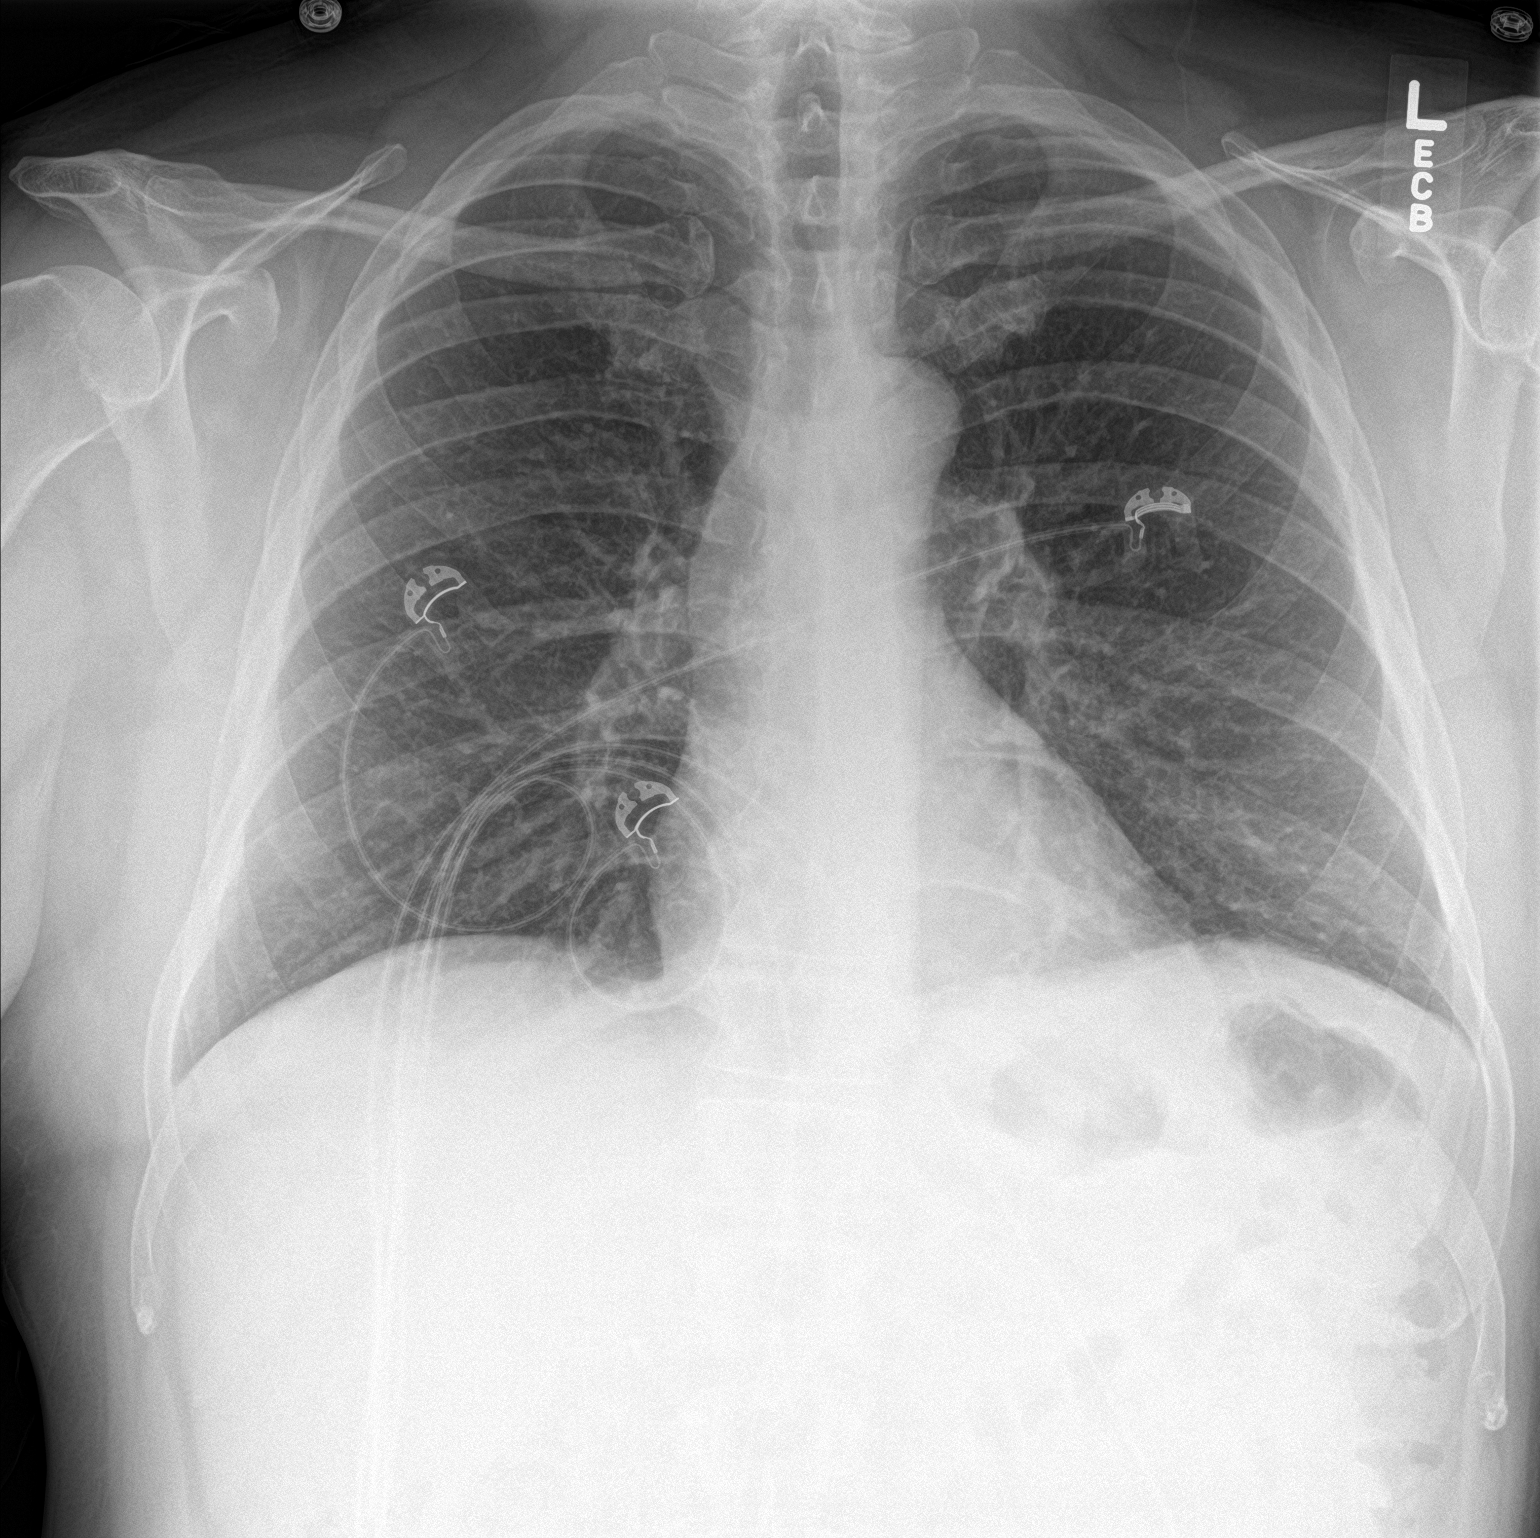

[chest lat]
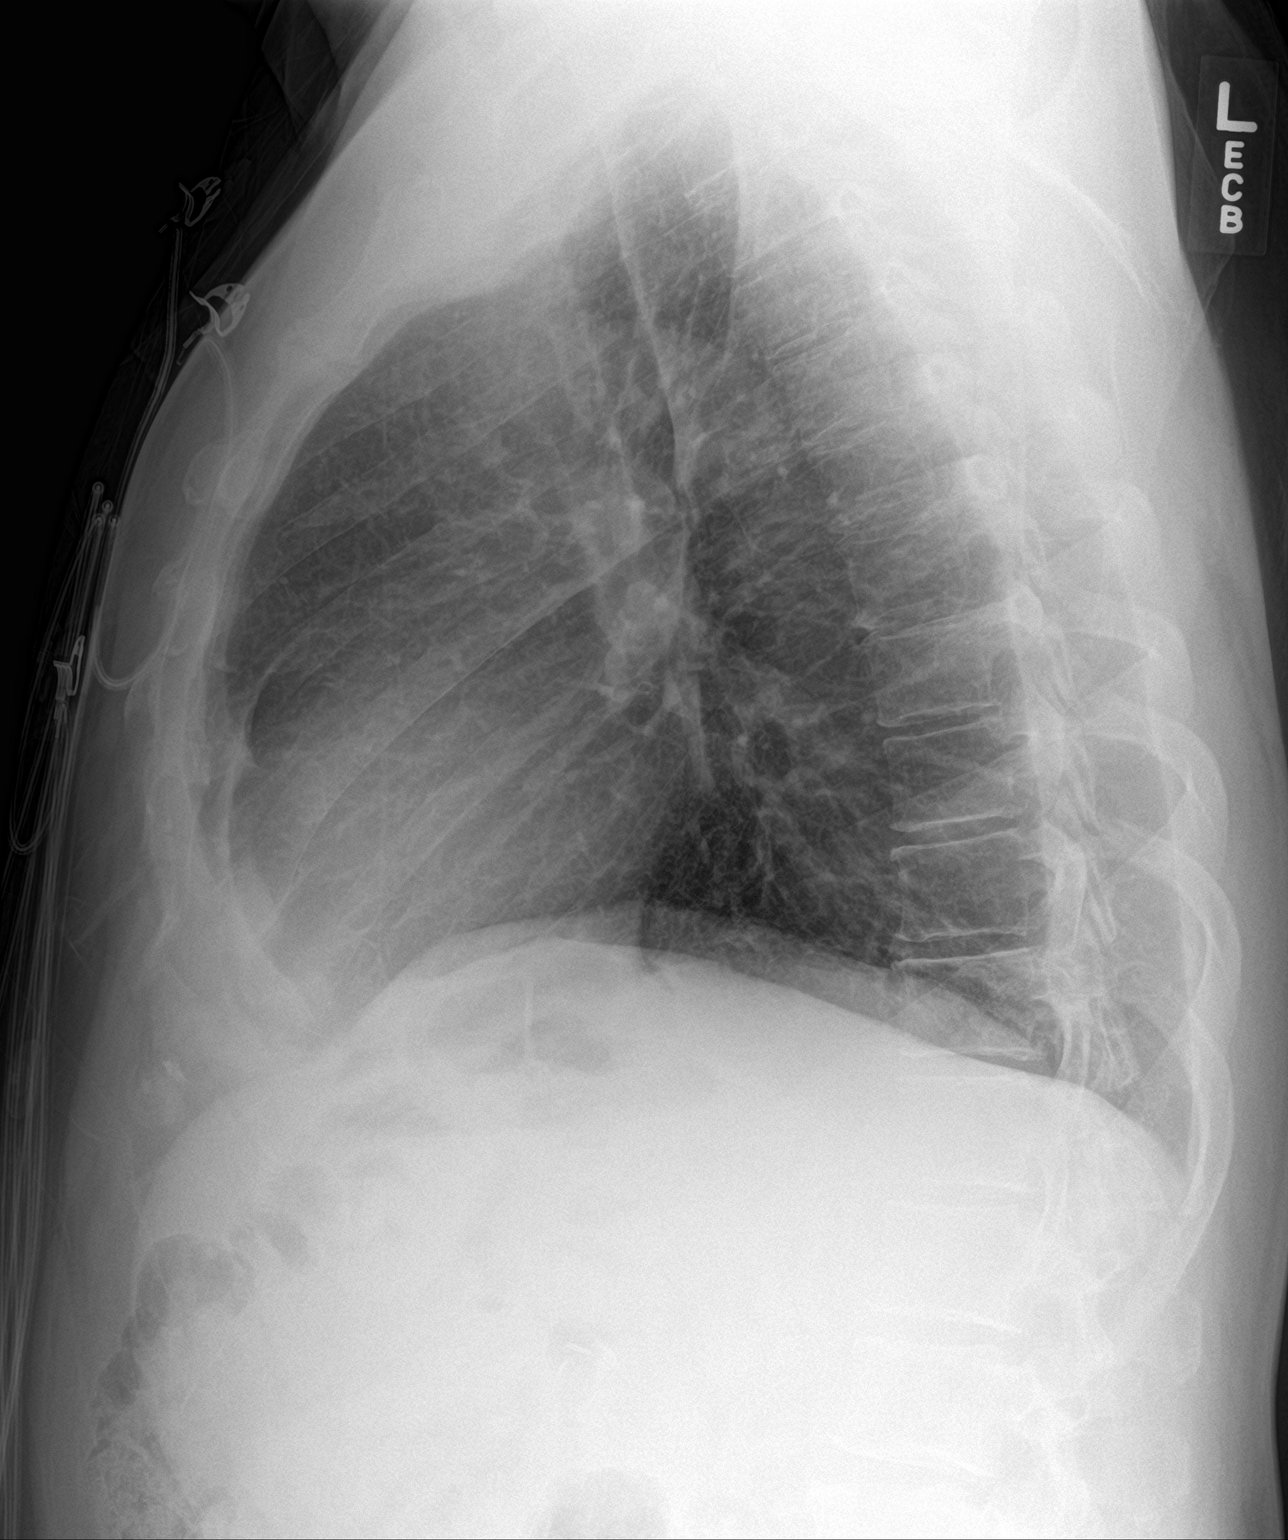

[2 of 2 positions shown; findings below may reference images not displayed]

FINDINGS: The heart size and mediastinal contours are within normal limits.
Both lungs are clear. The visualized skeletal structures are
unremarkable. Surgical clips in the right upper quadrant.
IMPRESSION: No active cardiopulmonary disease.

## 2019-06-07 DIAGNOSIS — D649 Anemia, unspecified: Secondary | ICD-10-CM | POA: Diagnosis not present

## 2019-06-15 DIAGNOSIS — R05 Cough: Secondary | ICD-10-CM | POA: Diagnosis not present

## 2019-06-15 DIAGNOSIS — Z20828 Contact with and (suspected) exposure to other viral communicable diseases: Secondary | ICD-10-CM | POA: Diagnosis not present

## 2019-06-15 DIAGNOSIS — H9209 Otalgia, unspecified ear: Secondary | ICD-10-CM | POA: Diagnosis not present

## 2019-06-15 DIAGNOSIS — R0982 Postnasal drip: Secondary | ICD-10-CM | POA: Diagnosis not present

## 2019-06-15 DIAGNOSIS — R0989 Other specified symptoms and signs involving the circulatory and respiratory systems: Secondary | ICD-10-CM | POA: Diagnosis not present

## 2019-06-15 DIAGNOSIS — R0682 Tachypnea, not elsewhere classified: Secondary | ICD-10-CM | POA: Diagnosis not present

## 2019-06-30 DIAGNOSIS — Z23 Encounter for immunization: Secondary | ICD-10-CM | POA: Diagnosis not present

## 2019-08-08 DIAGNOSIS — R030 Elevated blood-pressure reading, without diagnosis of hypertension: Secondary | ICD-10-CM | POA: Diagnosis not present

## 2019-08-08 DIAGNOSIS — K047 Periapical abscess without sinus: Secondary | ICD-10-CM | POA: Diagnosis not present

## 2024-03-09 ENCOUNTER — Encounter (HOSPITAL_BASED_OUTPATIENT_CLINIC_OR_DEPARTMENT_OTHER): Payer: Self-pay

## 2024-03-09 ENCOUNTER — Ambulatory Visit (HOSPITAL_BASED_OUTPATIENT_CLINIC_OR_DEPARTMENT_OTHER)
Admission: RE | Admit: 2024-03-09 | Discharge: 2024-03-09 | Disposition: A | Discharge status: Home / Self Care | Source: Ambulatory Visit

## 2024-03-09 VITALS — BP 130/89 | HR 65 | Temp 98.6°F | Resp 20

## 2024-03-09 DIAGNOSIS — J40 Bronchitis, not specified as acute or chronic: Secondary | ICD-10-CM | POA: Diagnosis not present

## 2024-03-09 MED ORDER — AMOXICILLIN-POT CLAVULANATE 875-125 MG PO TABS: 1.0000 | ORAL_TABLET | Freq: Two times a day (BID) | ORAL | 0 refills | Status: AC

## 2024-03-09 MED ORDER — PREDNISONE 20 MG PO TABS: 40.0000 mg | ORAL_TABLET | Freq: Every day | ORAL | 0 refills | Status: AC

## 2024-03-09 MED ORDER — TRIAMCINOLONE ACETONIDE 40 MG/ML IJ SUSP
40.0000 mg | Freq: Once | INTRAMUSCULAR | Status: AC
  Administered 2024-03-09: 40 mg via INTRAMUSCULAR

## 2024-03-09 NOTE — ED Triage Notes (Addendum)
 Moist productive cough onset 1 week ago. Patient states gets bronchitis often. Hx of COPD and asthma. Patient states he needs an antibiotic, and a steroid taper.

## 2024-03-09 NOTE — Discharge Instructions (Signed)
 Treating for bronchitis.  Take the antibiotics as prescribed.  Kenalog injection given here today. You can start the prednisone tomorrow.  Take with food.  Recommend Mucinex for congestion and cough over-the-counter.  Follow-up as needed

## 2024-03-12 NOTE — ED Provider Notes (Signed)
 [facility]    CSN: [id number] Arrival date & time: 03/09/24  1157      History   Chief Complaint Chief Complaint  Patient presents with   Cough    Just got back from cruise congested cough wheezing - Entered by patient    HPI Tom Sampson is a 53 y.o. male.   Patient is a 53 year old male presents today with moist productive cough onset 1 week ago.  Symptoms have been constant and worsening.  Past medical history of recurrent bronchitis. Hx of COPD and asthma.  No fever, chills, body aches or night sweats.   Cough   Past Medical History:  Diagnosis Date   Skin disorder     Patient Active Problem List   Diagnosis Date Noted   Skin disorder    Easy bruisability 10/31/2017    Past Surgical History:  Procedure Laterality Date   CHOLECYSTECTOMY     right knee surgery         Home Medications    Prior to Admission medications   Medication Sig Start Date End Date Taking? Authorizing Provider  albuterol  (PROVENTIL ) (2.5 MG/3ML) 0.083% nebulizer solution Take 2.5 mg by nebulization every 4 (four) hours as needed. 03/28/22  Yes [provider]  amLODipine (NORVASC) 5 MG tablet Take 1 tablet by mouth daily. 07/18/23  Yes [provider]  amoxicillin-clavulanate (AUGMENTIN) 875-125 MG tablet Take 1 tablet by mouth every 12 (twelve) hours. 03/09/24  Yes Isadore Bokhari A, FNP  atorvastatin (LIPITOR) 20 MG tablet Take 20 mg by mouth daily. 08/29/23  Yes [provider]  buPROPion (WELLBUTRIN XL) 150 MG 24 hr tablet Take 150 mg by mouth daily. 12/11/23  Yes [provider]  linaclotide Glory Larsen) 290 MCG CAPS capsule Take 290 mcg by mouth daily before breakfast. 12/28/17  Yes [provider]  montelukast (SINGULAIR) 10 MG tablet Take 10 mg by mouth at bedtime. 12/10/23  Yes [provider]  prazosin (MINIPRESS) 5 MG capsule Take 5 mg by mouth at bedtime. 06/02/23  Yes [provider]  predniSONE (DELTASONE) 20 MG  tablet Take 2 tablets (40 mg total) by mouth daily with breakfast for 5 days. 03/09/24 03/14/24 Yes Kalmen Lollar A, FNP  sertraline (ZOLOFT) 100 MG tablet Take 200 mg by mouth daily. 12/11/23  Yes [provider]  sildenafil (VIAGRA) 25 MG tablet Take 25 mg by mouth as needed. 09/04/22  Yes [provider]  traZODone (DESYREL) 50 MG tablet Take 1 tablet by mouth at bedtime. 05/31/23  Yes [provider]  cyclobenzaprine (FLEXERIL) 10 MG tablet Take 1 tablet by mouth every 8 (eight) hours. 11/16/17   [provider]  meloxicam (MOBIC) 7.5 MG tablet Take 1 tablet by mouth every 12 (twelve) hours. 11/22/17   [provider]  TRELEGY ELLIPTA 100-62.5-25 MCG/ACT AEPB Inhale 1 puff into the lungs daily.    [provider]    Family History Family History  Problem Relation Age of Onset   Diabetes Mother    Heart disease Mother    Diabetes Father    Arrhythmia Father    Diabetes Sister    Obesity Sister    Heart disease Brother    Heart attack Brother    Heart disease Maternal Uncle    Heart disease Paternal Aunt     Social History Social History   Tobacco Use   Smoking status: Every Day    Current packs/day: 0.50    Types: Cigarettes  Smokeless tobacco: Never  Substance Use Topics   Alcohol use: No   Drug use: No     Allergies   Patient has no known allergies.   Review of Systems Review of Systems  Respiratory:  Positive for cough.   See HPI   Physical Exam Triage Vital Signs ED Triage Vitals  Encounter Vitals Group     BP 03/09/24 1225 130/89     Systolic BP Percentile --      Diastolic BP Percentile --      Pulse Rate 03/09/24 1225 65     Resp 03/09/24 1225 20     Temp 03/09/24 1225 98.6 F (37 C)     Temp Source 03/09/24 1225 Oral     SpO2 03/09/24 1225 96 %     Weight --      Height --      Head Circumference --      Peak Flow --      Pain Score 03/09/24 1227 0     Pain Loc --      Pain Education --       Exclude from Growth Chart --    No data found.  Updated Vital Signs BP 130/89 (BP Location: Right Arm)   Pulse 65   Temp 98.6 F (37 C) (Oral)   Resp 20   SpO2 96%   Visual Acuity Right Eye Distance:   Left Eye Distance:   Bilateral Distance:    Right Eye Near:   Left Eye Near:    Bilateral Near:     Physical Exam Constitutional:      General: He is not in acute distress.    Appearance: Normal appearance. He is not ill-appearing, toxic-appearing or diaphoretic.  HENT:     Right Ear: Tympanic membrane, ear canal and external ear normal.     Left Ear: Tympanic membrane, ear canal and external ear normal.     Mouth/Throat:     Pharynx: Oropharynx is clear.  Eyes:     Conjunctiva/sclera: Conjunctivae normal.  Cardiovascular:     Rate and Rhythm: Normal rate and regular rhythm.     Pulses: Normal pulses.     Heart sounds: Normal heart sounds.  Pulmonary:     Effort: Pulmonary effort is normal.     Breath sounds: Wheezing and rhonchi present.  Musculoskeletal:        General: Normal range of motion.  Skin:    General: Skin is warm and dry.  Neurological:     Mental Status: He is alert.  Psychiatric:        Mood and Affect: Mood normal.      UC Treatments / Results  Labs (all labs ordered are listed, but only abnormal results are displayed) Labs Reviewed - No data to display  EKG   Radiology No results found.  Procedures Procedures (including critical care time)  Medications Ordered in UC Medications  triamcinolone acetonide (KENALOG-40) injection 40 mg (40 mg Intramuscular Given 03/09/24 1257)    Initial Impression / Assessment and Plan / UC Course  I have reviewed the triage vital signs and the nursing notes.  Pertinent labs & imaging results that were available during my care of the patient were reviewed by me and considered in my medical decision making (see chart for details).     Bronchitis-symptoms have been onset and present for 2 weeks and  worsening.  Will go ahead and treat with steroid injection here in clinic today.  Prescribing antibiotics  and steroid daily for the next 5 days.  Recommend over-the-counter Mucinex for congestion and cough. Continue to use inhalers as needed.  Follow-up as needed Final Clinical Impressions(s) / UC Diagnoses   Final diagnoses:  Bronchitis   Discharge Instructions      Treating for bronchitis.  Take the antibiotics as prescribed.  Kenalog injection given here today. You can start the prednisone tomorrow.  Take with food.  Recommend Mucinex for congestion and cough over-the-counter.  Follow-up as needed  ED Prescriptions     Medication Sig Dispense Auth. Provider   amoxicillin-clavulanate (AUGMENTIN) 875-125 MG tablet Take 1 tablet by mouth every 12 (twelve) hours. 14 tablet Cyana Shook A, FNP   predniSONE (DELTASONE) 20 MG tablet Take 2 tablets (40 mg total) by mouth daily with breakfast for 5 days. 10 tablet Landa Pine, FNP      PDMP not reviewed this encounter.   Landa Pine, FNP 03/12/24 [phone]
